# Patient Record
Sex: Male | Born: 1970 | Race: Black or African American | Hispanic: No | Smoking: Current every day smoker
Health system: Southern US, Community
[De-identification: ages and names within clinical notes are randomized; demographics above are authoritative.]

## PROBLEM LIST (undated history)

## (undated) DIAGNOSIS — I10 Essential (primary) hypertension: Secondary | ICD-10-CM

## (undated) DIAGNOSIS — I639 Cerebral infarction, unspecified: Secondary | ICD-10-CM

## (undated) HISTORY — PX: OTHER SURGICAL HISTORY: SHX169

---

## 2002-04-14 ENCOUNTER — Encounter: Payer: Self-pay | Admitting: Emergency Medicine

## 2002-04-14 ENCOUNTER — Emergency Department (HOSPITAL_COMMUNITY): Admission: EM | Admit: 2002-04-14 | Discharge: 2002-04-14 | Payer: Self-pay | Admitting: Emergency Medicine

## 2002-06-05 ENCOUNTER — Emergency Department (HOSPITAL_COMMUNITY): Admission: EM | Admit: 2002-06-05 | Discharge: 2002-06-05 | Payer: Self-pay | Admitting: *Deleted

## 2005-06-20 ENCOUNTER — Emergency Department (HOSPITAL_COMMUNITY): Admission: EM | Admit: 2005-06-20 | Discharge: 2005-06-20 | Payer: Self-pay | Admitting: Emergency Medicine

## 2006-08-13 ENCOUNTER — Encounter (INDEPENDENT_AMBULATORY_CARE_PROVIDER_SITE_OTHER): Payer: Self-pay | Admitting: Internal Medicine

## 2006-08-13 ENCOUNTER — Ambulatory Visit: Payer: Self-pay | Admitting: Internal Medicine

## 2006-08-13 LAB — CONVERTED CEMR LAB
AST: 22 units/L (ref 0–37)
Alkaline Phosphatase: 63 units/L (ref 39–117)
Bilirubin Urine: NEGATIVE
Calcium: 9.1 mg/dL (ref 8.4–10.5)
Eosinophils Absolute: 0.1 cells/mcL (ref 0.0–0.7)
Eosinophils Relative: 2 % (ref 0–5)
Glucose, Bld: 102 mg/dL — ABNORMAL HIGH (ref 70–99)
HCT: 46.1 % (ref 39.0–52.0)
HCV Ab: NEGATIVE
Hep B S Ab: NEGATIVE
Ketones, ur: NEGATIVE mg/dL
MCV: 84.3 fL (ref 78.0–100.0)
Monocytes Relative: 10 % (ref 3–11)
Neutro Abs: 2.8 10*3/uL (ref 1.7–7.7)
Neutrophils Relative %: 51 % (ref 43–77)
Nitrite: NEGATIVE
RBC: 5.47 M/uL (ref 4.22–5.81)
RDW: 13.1 % (ref 11.5–14.0)
Sodium: 143 meq/L (ref 135–145)
Specific Gravity, Urine: 1.02 (ref 1.005–1.03)
Total Bilirubin: 0.4 mg/dL (ref 0.3–1.2)
Total Protein: 7.1 g/dL (ref 6.0–8.3)
Urobilinogen, UA: 0.2 (ref 0.0–1.0)
pH: 6 (ref 5.0–8.0)

## 2009-06-03 ENCOUNTER — Emergency Department (HOSPITAL_COMMUNITY): Admission: EM | Admit: 2009-06-03 | Discharge: 2009-06-03 | Payer: Self-pay | Admitting: Family Medicine

## 2010-02-28 ENCOUNTER — Inpatient Hospital Stay (HOSPITAL_COMMUNITY): Admission: EM | Admit: 2010-02-28 | Discharge: 2010-03-02 | Payer: Self-pay | Admitting: Emergency Medicine

## 2010-02-28 ENCOUNTER — Ambulatory Visit: Payer: Self-pay | Admitting: Internal Medicine

## 2010-03-01 ENCOUNTER — Encounter (INDEPENDENT_AMBULATORY_CARE_PROVIDER_SITE_OTHER): Payer: Self-pay | Admitting: Neurology

## 2010-03-02 ENCOUNTER — Encounter: Payer: Self-pay | Admitting: Internal Medicine

## 2010-03-02 ENCOUNTER — Ambulatory Visit: Payer: Self-pay | Admitting: Surgery

## 2010-11-02 ENCOUNTER — Emergency Department (HOSPITAL_COMMUNITY)
Admission: EM | Admit: 2010-11-02 | Discharge: 2010-11-02 | Payer: Self-pay | Source: Home / Self Care | Admitting: Emergency Medicine

## 2010-11-06 LAB — DIFFERENTIAL
Eosinophils Absolute: 0.1 10*3/uL (ref 0.0–0.7)
Eosinophils Relative: 2 % (ref 0–5)
Lymphocytes Relative: 36 % (ref 12–46)
Monocytes Absolute: 1 10*3/uL (ref 0.1–1.0)
Neutrophils Relative %: 49 % (ref 43–77)

## 2010-11-06 LAB — POCT I-STAT, CHEM 8
Calcium, Ion: 1.14 mmol/L (ref 1.12–1.32)
Chloride: 104 mEq/L (ref 96–112)
Creatinine, Ser: 1.1 mg/dL (ref 0.4–1.5)
Glucose, Bld: 72 mg/dL (ref 70–99)
Hemoglobin: 17 g/dL (ref 13.0–17.0)
Sodium: 141 mEq/L (ref 135–145)

## 2010-11-06 LAB — CK: Total CK: 183 U/L (ref 7–232)

## 2010-11-06 LAB — TROPONIN I: Troponin I: 0.02 ng/mL (ref 0.00–0.06)

## 2010-11-06 LAB — CBC
HCT: 46.7 % (ref 39.0–52.0)
MCV: 86.5 fL (ref 78.0–100.0)
Platelets: 220 10*3/uL (ref 150–400)

## 2011-01-01 LAB — CARDIAC PANEL(CRET KIN+CKTOT+MB+TROPI)
CK, MB: 1.2 ng/mL (ref 0.3–4.0)
Relative Index: 0.9 (ref 0.0–2.5)
Total CK: 128 U/L (ref 7–232)
Troponin I: <0.01 ng/mL (ref 0.00–0.06)

## 2011-01-01 LAB — BASIC METABOLIC PANEL
CO2: 27 mEq/L (ref 19–32)
Calcium: 8.7 mg/dL (ref 8.4–10.5)
GFR calc Af Amer: >60 mL/min (ref >60)

## 2011-01-01 LAB — COMPREHENSIVE METABOLIC PANEL
Albumin: 3.7 g/dL (ref 3.5–5.2)
BUN: 6 mg/dL (ref 6–23)
GFR calc Af Amer: >60 mL/min (ref >60)
Glucose, Bld: 85 mg/dL (ref 70–99)
Sodium: 142 mEq/L (ref 135–145)
Total Bilirubin: 0.8 mg/dL (ref 0.3–1.2)
Total Protein: 6.8 g/dL (ref 6.0–8.3)

## 2011-01-01 LAB — DIFFERENTIAL
Basophils Relative: 0 % (ref 0–1)
Eosinophils Absolute: 0.1 10*3/uL (ref 0.0–0.7)
Eosinophils Absolute: 0.2 10*3/uL (ref 0.0–0.7)
Eosinophils Relative: 1 % (ref 0–5)
Eosinophils Relative: 2 % (ref 0–5)
Lymphs Abs: 2.4 10*3/uL (ref 0.7–4.0)
Lymphs Abs: 2.5 10*3/uL (ref 0.7–4.0)
Neutro Abs: 3.4 10*3/uL (ref 1.7–7.7)
Neutrophils Relative %: 60 % (ref 43–77)

## 2011-01-01 LAB — URINALYSIS, ROUTINE W REFLEX MICROSCOPIC
Bilirubin Urine: NEGATIVE
Glucose, UA: NEGATIVE mg/dL
Nitrite: NEGATIVE
Protein, ur: NEGATIVE mg/dL
pH: 5 (ref 5.0–8.0)

## 2011-01-01 LAB — GLUCOSE, CAPILLARY
Glucose-Capillary: 103 mg/dL — ABNORMAL HIGH (ref 70–99)
Glucose-Capillary: 117 mg/dL — ABNORMAL HIGH (ref 70–99)
Glucose-Capillary: 73 mg/dL (ref 70–99)

## 2011-01-01 LAB — CK TOTAL AND CKMB (NOT AT ARMC)
Relative Index: 1 (ref 0.0–2.5)
Total CK: 128 U/L (ref 7–232)

## 2011-01-01 LAB — PROTIME-INR
INR: 1.04 (ref 0.00–1.49)
Prothrombin Time: 12.8 seconds (ref 11.6–15.2)
Prothrombin Time: 13.5 seconds (ref 11.6–15.2)

## 2011-01-01 LAB — LUPUS ANTICOAGULANT PANEL: Lupus Anticoagulant: NOT DETECTED

## 2011-01-01 LAB — ETHANOL: Alcohol, Ethyl (B): <5 mg/dL (ref 0–10)

## 2011-01-01 LAB — CBC
HCT: 47.5 % (ref 39.0–52.0)
Hemoglobin: 15.8 g/dL (ref 13.0–17.0)
MCHC: 34.5 g/dL (ref 30.0–36.0)
MCHC: 34.5 g/dL (ref 30.0–36.0)
Platelets: 202 10*3/uL (ref 150–400)
Platelets: 210 10*3/uL (ref 150–400)
RDW: 12.6 % (ref 11.5–15.5)
RDW: 12.8 % (ref 11.5–15.5)
WBC: 6.7 10*3/uL (ref 4.0–10.5)

## 2011-01-01 LAB — CARDIOLIPIN ANTIBODIES, IGG, IGM, IGA: Anticardiolipin IgM: 3 MPL U/mL — ABNORMAL LOW (ref <11)

## 2011-01-01 LAB — BETA-2-GLYCOPROTEIN I ABS, IGG/M/A
Beta-2 Glyco I IgG: 1 G Units (ref <20)
Beta-2-Glycoprotein I IgA: 4 A Units (ref <20)

## 2011-01-01 LAB — TROPONIN I: Troponin I: <0.01 ng/mL (ref 0.00–0.06)

## 2011-01-01 LAB — LIPID PANEL
Cholesterol: 173 mg/dL (ref 0–200)
HDL: 46 mg/dL (ref >39)
Total CHOL/HDL Ratio: 3.8 RATIO
VLDL: 15 mg/dL (ref 0–40)

## 2011-01-01 LAB — ANTITHROMBIN III: AntiThromb III Func: 99 % (ref 76–126)

## 2011-01-01 LAB — RAPID URINE DRUG SCREEN, HOSP PERFORMED: Tetrahydrocannabinol: POSITIVE — AB

## 2011-01-01 LAB — URINE MICROSCOPIC-ADD ON

## 2011-01-01 LAB — FACTOR 5 LEIDEN

## 2011-01-01 LAB — PROTEIN S, TOTAL: Protein S Ag, Total: 82 % (ref 70–140)

## 2011-01-01 LAB — MRSA PCR SCREENING: MRSA by PCR: NEGATIVE

## 2011-01-01 LAB — APTT: aPTT: 30 seconds (ref 24–37)

## 2015-09-16 ENCOUNTER — Emergency Department (HOSPITAL_COMMUNITY)
Admission: EM | Admit: 2015-09-16 | Discharge: 2015-09-16 | Disposition: A | Discharge status: Home / Self Care | Payer: Self-pay | Attending: Emergency Medicine | Admitting: Emergency Medicine

## 2015-09-16 ENCOUNTER — Encounter (HOSPITAL_COMMUNITY): Payer: Self-pay | Admitting: Emergency Medicine

## 2015-09-16 DIAGNOSIS — I10 Essential (primary) hypertension: Secondary | ICD-10-CM | POA: Insufficient documentation

## 2015-09-16 DIAGNOSIS — Z8673 Personal history of transient ischemic attack (TIA), and cerebral infarction without residual deficits: Secondary | ICD-10-CM | POA: Insufficient documentation

## 2015-09-16 DIAGNOSIS — L03211 Cellulitis of face: Secondary | ICD-10-CM | POA: Insufficient documentation

## 2015-09-16 DIAGNOSIS — F17200 Nicotine dependence, unspecified, uncomplicated: Secondary | ICD-10-CM | POA: Insufficient documentation

## 2015-09-16 HISTORY — DX: Essential (primary) hypertension: I10

## 2015-09-16 HISTORY — DX: Cerebral infarction, unspecified: I63.9

## 2015-09-16 MED ORDER — SULFAMETHOXAZOLE-TRIMETHOPRIM 800-160 MG PO TABS: 1.0000 | ORAL_TABLET | Freq: Two times a day (BID) | ORAL | Status: AC

## 2015-09-16 MED ORDER — MUPIROCIN CALCIUM 2 % NA OINT: TOPICAL_OINTMENT | NASAL | Status: DC

## 2015-09-16 MED ORDER — CEPHALEXIN 500 MG PO CAPS: 500.0000 mg | ORAL_CAPSULE | Freq: Two times a day (BID) | ORAL | Status: DC

## 2015-09-16 NOTE — ED Notes (Signed)
Pt has hx of CVA 4 years ago. BP elevated. No longer has PCP. Was preciously on "blood thinner" but just takes ASA now.

## 2015-09-16 NOTE — ED Notes (Signed)
Swelling to right nare that has extended through entire nose, onset 3-4 days ago. States had a 'pimple' there for several months that was not sore until last 3-4 days.
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
## 2015-09-16 NOTE — Discharge Instructions (Signed)
Cellulitis °Cellulitis is an infection of the skin and the tissue beneath it. The infected area is usually red and tender. Cellulitis occurs most often in the arms and lower legs.  °CAUSES  °Cellulitis is caused by bacteria that enter the skin through cracks or cuts in the skin. The most common types of bacteria that cause cellulitis are staphylococci and streptococci. °SIGNS AND SYMPTOMS  °· Redness and warmth. °· Swelling. °· Tenderness or pain. °· Fever. °DIAGNOSIS  °Your health care provider can usually determine what is wrong based on a physical exam. Blood tests may also be done. °TREATMENT  °Treatment usually involves taking an antibiotic medicine. °HOME CARE INSTRUCTIONS  °· Take your antibiotic medicine as directed by your health care provider. Finish the antibiotic even if you start to feel better. °· Keep the infected arm or leg elevated to reduce swelling. °· Apply a warm cloth to the affected area up to 4 times per day to relieve pain. °· Take medicines only as directed by your health care provider. °· Keep all follow-up visits as directed by your health care provider. °SEEK MEDICAL CARE IF:  °· You notice red streaks coming from the infected area. °· Your red area gets larger or turns dark in color. °· Your bone or joint underneath the infected area becomes painful after the skin has healed. °· Your infection returns in the same area or another area. °· You notice a swollen bump in the infected area. °· You develop new symptoms. °· You have a fever. °SEEK IMMEDIATE MEDICAL CARE IF:  °· You feel very sleepy. °· You develop vomiting or diarrhea. °· You have a general ill feeling (malaise) with muscle aches and pains. °  °This information is not intended to replace advice given to you by your health care provider. Make sure you discuss any questions you have with your health care provider. °  °Document Released: 07/11/2005 Document Revised: 06/22/2015 Document Reviewed: 12/17/2011 °Elsevier Interactive  Patient Education ©2016 Elsevier Inc. ° °Hypertension °Hypertension, commonly called high blood pressure, is when the force of blood pumping through your arteries is too strong. Your arteries are the blood vessels that carry blood from your heart throughout your body. A blood pressure reading consists of a higher number over a lower number, such as 110/72. The higher number (systolic) is the pressure inside your arteries when your heart pumps. The lower number (diastolic) is the pressure inside your arteries when your heart relaxes. Ideally you want your blood pressure below 120/80. °Hypertension forces your heart to work harder to pump blood. Your arteries may become narrow or stiff. Having untreated or uncontrolled hypertension can cause heart attack, stroke, kidney disease, and other problems. °RISK FACTORS °Some risk factors for high blood pressure are controllable. Others are not.  °Risk factors you cannot control include:  °· Race. You may be at higher risk if you are African American. °· Age. Risk increases with age. °· Gender. Men are at higher risk than women before age 45 years. After age 65, women are at higher risk than men. °Risk factors you can control include: °· Not getting enough exercise or physical activity. °· Being overweight. °· Getting too much fat, sugar, calories, or salt in your diet. °· Drinking too much alcohol. °SIGNS AND SYMPTOMS °Hypertension does not usually cause signs or symptoms. Extremely high blood pressure (hypertensive crisis) may cause headache, anxiety, shortness of breath, and nosebleed. °DIAGNOSIS °To check if you have hypertension, your health care provider will measure your   blood pressure while you are seated, with your arm held at the level of your heart. It should be measured at least twice using the same arm. Certain conditions can cause a difference in blood pressure between your right and left arms. A blood pressure reading that is higher than normal on one occasion  does not mean that you need treatment. If it is not clear whether you have high blood pressure, you may be asked to return on a different day to have your blood pressure checked again. Or, you may be asked to monitor your blood pressure at home for 1 or more weeks. TREATMENT Treating high blood pressure includes making lifestyle changes and possibly taking medicine. Living a healthy lifestyle can help lower high blood pressure. You may need to change some of your habits. Lifestyle changes may include:  Following the DASH diet. This diet is high in fruits, vegetables, and whole grains. It is low in salt, red meat, and added sugars.  Keep your sodium intake below 2,300 mg per day.  Getting at least 30-45 minutes of aerobic exercise at least 4 times per week.  Losing weight if necessary.  Not smoking.  Limiting alcoholic beverages.  Learning ways to reduce stress. Your health care provider may prescribe medicine if lifestyle changes are not enough to get your blood pressure under control, and if one of the following is true:  You are 42-62 years of age and your systolic blood pressure is above 140.  You are 86 years of age or older, and your systolic blood pressure is above 150.  Your diastolic blood pressure is above 90.  You have diabetes, and your systolic blood pressure is over 140 or your diastolic blood pressure is over 90.  You have kidney disease and your blood pressure is above 140/90.  You have heart disease and your blood pressure is above 140/90. Your personal target blood pressure may vary depending on your medical conditions, your age, and other factors. HOME CARE INSTRUCTIONS  Have your blood pressure rechecked as directed by your health care provider.   Take medicines only as directed by your health care provider. Follow the directions carefully. Blood pressure medicines must be taken as prescribed. The medicine does not work as well when you skip doses. Skipping  doses also puts you at risk for problems.  Do not smoke.   Monitor your blood pressure at home as directed by your health care provider. SEEK MEDICAL CARE IF:   You think you are having a reaction to medicines taken.  You have recurrent headaches or feel dizzy.  You have swelling in your ankles.  You have trouble with your vision. SEEK IMMEDIATE MEDICAL CARE IF:  You develop a severe headache or confusion.  You have unusual weakness, numbness, or feel faint.  You have severe chest or abdominal pain.  You vomit repeatedly.  You have trouble breathing. MAKE SURE YOU:   Understand these instructions.  Will watch your condition.  Will get help right away if you are not doing well or get worse.   This information is not intended to replace advice given to you by your health care provider. Make sure you discuss any questions you have with your health care provider.   Document Released: 10/01/2005 Document Revised: 02/15/2015 Document Reviewed: 07/24/2013 Elsevier Interactive Patient Education 2016 Elsevier Inc.  DASH Eating Plan DASH stands for "Dietary Approaches to Stop Hypertension." The DASH eating plan is a healthy eating plan that has been shown to  reduce high blood pressure (hypertension). Additional health benefits may include reducing the risk of type 2 diabetes mellitus, heart disease, and stroke. The DASH eating plan may also help with weight loss. WHAT DO I NEED TO KNOW ABOUT THE DASH EATING PLAN? For the DASH eating plan, you will follow these general guidelines:  Choose foods with a percent daily value for sodium of less than 5% (as listed on the food label).  Use salt-free seasonings or herbs instead of table salt or sea salt.  Check with your health care provider or pharmacist before using salt substitutes.  Eat lower-sodium products, often labeled as "lower sodium" or "no salt added."  Eat fresh foods.  Eat more vegetables, fruits, and low-fat dairy  products.  Choose whole grains. Look for the word "whole" as the first word in the ingredient list.  Choose fish and skinless chicken or [place] more often than red meat. Limit fish, poultry, and meat to 6 oz (170 g) each day.  Limit sweets, desserts, sugars, and sugary drinks.  Choose heart-healthy fats.  Limit cheese to 1 oz (28 g) per day.  Eat more home-cooked food and less restaurant, buffet, and fast food.  Limit fried foods.  Cook foods using methods other than frying.  Limit canned vegetables. If you do use them, rinse them well to decrease the sodium.  When eating at a restaurant, ask that your food be prepared with less salt, or no salt if possible. WHAT FOODS CAN I EAT? Seek help from a dietitian for individual calorie needs. Grains Whole grain or whole wheat bread. Brown rice. Whole grain or whole wheat pasta. Quinoa, bulgur, and whole grain cereals. Low-sodium cereals. Corn or whole wheat flour tortillas. Whole grain cornbread. Whole grain crackers. Low-sodium crackers. Vegetables Fresh or frozen vegetables (raw, steamed, roasted, or grilled). Low-sodium or reduced-sodium tomato and vegetable juices. Low-sodium or reduced-sodium tomato sauce and paste. Low-sodium or reduced-sodium canned vegetables.  Fruits All fresh, canned (in natural juice), or frozen fruits. Meat and Other Protein Products Ground beef (85% or leaner), grass-fed beef, or beef trimmed of fat. Skinless chicken or [place]. Ground chicken or [place]. Pork trimmed of fat. All fish and seafood. Eggs. Dried beans, peas, or lentils. Unsalted nuts and seeds. Unsalted canned beans. Dairy Low-fat dairy products, such as skim or 1% milk, 2% or reduced-fat cheeses, low-fat ricotta or cottage cheese, or plain low-fat yogurt. Low-sodium or reduced-sodium cheeses. Fats and Oils Tub margarines without trans fats. Light or reduced-fat mayonnaise and salad dressings (reduced sodium). Avocado. Safflower, olive, or canola  oils. Natural peanut or almond butter. Other Unsalted popcorn and pretzels. The items listed above may not be a complete list of recommended foods or beverages. Contact your dietitian for more options. WHAT FOODS ARE NOT RECOMMENDED? Grains White bread. White pasta. White rice. Refined cornbread. Bagels and croissants. Crackers that contain trans fat. Vegetables Creamed or fried vegetables. Vegetables in a cheese sauce. Regular canned vegetables. Regular canned tomato sauce and paste. Regular tomato and vegetable juices. Fruits Dried fruits. Canned fruit in light or heavy syrup. Fruit juice. Meat and Other Protein Products Fatty cuts of meat. Ribs, chicken wings, bacon, sausage, bologna, salami, chitterlings, fatback, hot dogs, bratwurst, and packaged luncheon meats. Salted nuts and seeds. Canned beans with salt. Dairy Whole or 2% milk, cream, half-and-half, and cream cheese. Whole-fat or sweetened yogurt. Full-fat cheeses or blue cheese. Nondairy creamers and whipped toppings. Processed cheese, cheese spreads, or cheese curds. Condiments Onion and garlic salt, seasoned salt, table  salt, and sea salt. Canned and packaged gravies. Worcestershire sauce. Tartar sauce. Barbecue sauce. Teriyaki sauce. Soy sauce, including reduced sodium. Steak sauce. Fish sauce. Oyster sauce. Cocktail sauce. Horseradish. Ketchup and mustard. Meat flavorings and tenderizers. Bouillon cubes. Hot sauce. Tabasco sauce. Marinades. Taco seasonings. Relishes. Fats and Oils Butter, stick margarine, lard, shortening, ghee, and bacon fat. Coconut, palm kernel, or palm oils. Regular salad dressings. Other Pickles and olives. Salted popcorn and pretzels. The items listed above may not be a complete list of foods and beverages to avoid. Contact your dietitian for more information. WHERE CAN I FIND MORE INFORMATION? National Heart, Lung, and Blood Institute: CableProm[employer].itwww.nhlbi.nih.gov/health/health-topics/topics/dash/   This  information is not intended to replace advice given to you by your health care provider. Make sure you discuss any questions you have with your health care provider.   Document Released: 09/20/2011 Document Revised: 10/22/2014 Document Reviewed: 08/05/2013 Elsevier Interactive Patient Education [employer]2016 Elsevier Inc.

## 2016-01-14 ENCOUNTER — Emergency Department (HOSPITAL_COMMUNITY)
Admission: EM | Admit: 2016-01-14 | Discharge: 2016-01-14 | Disposition: A | Discharge status: Home / Self Care | Payer: MEDICAID | Attending: Emergency Medicine | Admitting: Emergency Medicine

## 2016-01-14 ENCOUNTER — Encounter (HOSPITAL_COMMUNITY): Payer: Self-pay | Admitting: Physical Medicine and Rehabilitation

## 2016-01-14 DIAGNOSIS — Z8673 Personal history of transient ischemic attack (TIA), and cerebral infarction without residual deficits: Secondary | ICD-10-CM | POA: Insufficient documentation

## 2016-01-14 DIAGNOSIS — I10 Essential (primary) hypertension: Secondary | ICD-10-CM | POA: Insufficient documentation

## 2016-01-14 DIAGNOSIS — Z79899 Other long term (current) drug therapy: Secondary | ICD-10-CM | POA: Insufficient documentation

## 2016-01-14 DIAGNOSIS — F17200 Nicotine dependence, unspecified, uncomplicated: Secondary | ICD-10-CM | POA: Insufficient documentation

## 2016-01-14 DIAGNOSIS — L03211 Cellulitis of face: Secondary | ICD-10-CM

## 2016-01-14 MED ORDER — SULFAMETHOXAZOLE-TRIMETHOPRIM 800-160 MG PO TABS: 1.0000 | ORAL_TABLET | Freq: Two times a day (BID) | ORAL | Status: AC

## 2016-01-14 MED ORDER — CEPHALEXIN 500 MG PO CAPS: 500.0000 mg | ORAL_CAPSULE | Freq: Two times a day (BID) | ORAL | Status: DC

## 2016-01-14 MED ORDER — MUPIROCIN CALCIUM 2 % NA OINT: TOPICAL_OINTMENT | NASAL | Status: DC

## 2016-01-14 NOTE — ED Notes (Signed)
Pt reports he noticed upper lip swelling several days ago. History of angioedema. Now reports upper lip tingling and swelling. Denies respiratory issues.

## 2016-01-14 NOTE — ED Notes (Signed)
Declined W/C at D/C and was escorted to lobby by RN. 

## 2016-01-14 NOTE — Discharge Instructions (Signed)
Cellulitis Cellulitis is an infection of the skin and the tissue beneath it. The infected area is usually red and tender. Cellulitis occurs most often in the arms and lower legs.  CAUSES  Cellulitis is caused by bacteria that enter the skin through cracks or cuts in the skin. The most common types of bacteria that cause cellulitis are staphylococci and streptococci. SIGNS AND SYMPTOMS   Redness and warmth.  Swelling.  Tenderness or pain.  Fever. DIAGNOSIS  Your health care provider can usually determine what is wrong based on a physical exam. Blood tests may also be done. TREATMENT  Treatment usually involves taking an antibiotic medicine. HOME CARE INSTRUCTIONS   Take your antibiotic medicine as directed by your health care provider. Finish the antibiotic even if you start to feel better.  Keep the infected arm or leg elevated to reduce swelling.  Apply a warm cloth to the affected area up to 4 times per day to relieve pain.  Take medicines only as directed by your health care provider.  Keep all follow-up visits as directed by your health care provider. SEEK MEDICAL CARE IF:   You notice red streaks coming from the infected area.  Your red area gets larger or turns dark in color.  Your bone or joint underneath the infected area becomes painful after the skin has healed.  Your infection returns in the same area or another area.  You notice a swollen bump in the infected area.  You develop new symptoms.  You have a fever. SEEK IMMEDIATE MEDICAL CARE IF:   You feel very sleepy.  You develop vomiting or diarrhea.  You have a general ill feeling (malaise) with muscle aches and pains.   This information is not intended to replace advice given to you by your health care provider. Make sure you discuss any questions you have with your health care provider.  Apply ointment to each nostril daily. Take antibiotics as prescribed. Return to the emergency department if you  experience severe worsening of your symptoms, fever, increased facial swelling.
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
## 2016-01-14 NOTE — ED Notes (Signed)
PT was treated a few week ago for infection to upper lip. Pt thinks the meds he was given has not helped his infection to upper lip.Pt's upper lip is swollen no RESp distress noted.

## 2016-03-12 ENCOUNTER — Encounter (HOSPITAL_COMMUNITY): Payer: Self-pay | Admitting: Nurse Practitioner

## 2016-03-12 ENCOUNTER — Emergency Department (HOSPITAL_COMMUNITY)
Admission: EM | Admit: 2016-03-12 | Discharge: 2016-03-12 | Disposition: A | Discharge status: Home / Self Care | Payer: MEDICAID | Attending: Emergency Medicine | Admitting: Emergency Medicine

## 2016-03-12 DIAGNOSIS — Z792 Long term (current) use of antibiotics: Secondary | ICD-10-CM | POA: Insufficient documentation

## 2016-03-12 DIAGNOSIS — Z8639 Personal history of other endocrine, nutritional and metabolic disease: Secondary | ICD-10-CM | POA: Insufficient documentation

## 2016-03-12 DIAGNOSIS — F17200 Nicotine dependence, unspecified, uncomplicated: Secondary | ICD-10-CM | POA: Insufficient documentation

## 2016-03-12 DIAGNOSIS — Z79899 Other long term (current) drug therapy: Secondary | ICD-10-CM | POA: Insufficient documentation

## 2016-03-12 DIAGNOSIS — M542 Cervicalgia: Secondary | ICD-10-CM | POA: Insufficient documentation

## 2016-03-12 DIAGNOSIS — Z8673 Personal history of transient ischemic attack (TIA), and cerebral infarction without residual deficits: Secondary | ICD-10-CM | POA: Insufficient documentation

## 2016-03-12 MED ORDER — CYCLOBENZAPRINE HCL 10 MG PO TABS: 10.0000 mg | ORAL_TABLET | Freq: Two times a day (BID) | ORAL | Status: DC | PRN

## 2016-03-12 MED ORDER — DIAZEPAM 5 MG PO TABS: 5.0000 mg | ORAL_TABLET | Freq: Two times a day (BID) | ORAL | Status: DC
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
## 2016-03-12 NOTE — ED Notes (Signed)
He c/o 3 day history of neck pain. Describes as a "crick" in neck. He tried heat, ice, aleve with no relief. He is alert and breathing easily

## 2016-12-12 ENCOUNTER — Emergency Department (HOSPITAL_COMMUNITY)
Admission: EM | Admit: 2016-12-12 | Discharge: 2016-12-12 | Disposition: A | Discharge status: Home / Self Care | Payer: Self-pay | Attending: Emergency Medicine | Admitting: Emergency Medicine

## 2016-12-12 DIAGNOSIS — Z8673 Personal history of transient ischemic attack (TIA), and cerebral infarction without residual deficits: Secondary | ICD-10-CM | POA: Insufficient documentation

## 2016-12-12 DIAGNOSIS — Z79899 Other long term (current) drug therapy: Secondary | ICD-10-CM | POA: Insufficient documentation

## 2016-12-12 DIAGNOSIS — L03211 Cellulitis of face: Secondary | ICD-10-CM | POA: Insufficient documentation

## 2016-12-12 DIAGNOSIS — I10 Essential (primary) hypertension: Secondary | ICD-10-CM | POA: Insufficient documentation

## 2016-12-12 DIAGNOSIS — F17200 Nicotine dependence, unspecified, uncomplicated: Secondary | ICD-10-CM | POA: Insufficient documentation

## 2016-12-12 MED ORDER — SULFAMETHOXAZOLE-TRIMETHOPRIM 800-160 MG PO TABS: 1.0000 | ORAL_TABLET | Freq: Two times a day (BID) | ORAL | 0 refills | Status: AC

## 2016-12-12 MED ORDER — SULFAMETHOXAZOLE-TRIMETHOPRIM 800-160 MG PO TABS
1.0000 | ORAL_TABLET | Freq: Two times a day (BID) | ORAL | 0 refills | Status: DC
Start: 2016-12-12 — End: 2016-12-12

## 2016-12-12 MED ORDER — MUPIROCIN 2 % EX OINT: 1.0000 "application " | TOPICAL_OINTMENT | Freq: Three times a day (TID) | CUTANEOUS | 0 refills | Status: DC

## 2016-12-12 MED ORDER — CEPHALEXIN 500 MG PO CAPS: 1000.0000 mg | ORAL_CAPSULE | Freq: Two times a day (BID) | ORAL | 0 refills | Status: DC

## 2016-12-12 NOTE — ED Triage Notes (Signed)
Pt from home w/ c/o swelling to face. Pt sts he has hx of same w/ cellulitis to back and face. Sts he was prescribed penicillin last time and he sts he believes it came back. Pt RR even and unlabored. Denies pain @ this time.
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
## 2017-02-15 ENCOUNTER — Emergency Department (HOSPITAL_COMMUNITY)
Admission: EM | Admit: 2017-02-15 | Discharge: 2017-02-15 | Disposition: A | Discharge status: Home / Self Care | Payer: Self-pay | Attending: Emergency Medicine | Admitting: Emergency Medicine

## 2017-02-15 ENCOUNTER — Encounter (HOSPITAL_COMMUNITY): Payer: Self-pay | Admitting: Emergency Medicine

## 2017-02-15 ENCOUNTER — Emergency Department (HOSPITAL_COMMUNITY): Payer: Self-pay

## 2017-02-15 DIAGNOSIS — I10 Essential (primary) hypertension: Secondary | ICD-10-CM | POA: Insufficient documentation

## 2017-02-15 DIAGNOSIS — Z79899 Other long term (current) drug therapy: Secondary | ICD-10-CM | POA: Insufficient documentation

## 2017-02-15 DIAGNOSIS — R935 Abnormal findings on diagnostic imaging of other abdominal regions, including retroperitoneum: Secondary | ICD-10-CM | POA: Insufficient documentation

## 2017-02-15 DIAGNOSIS — L03317 Cellulitis of buttock: Secondary | ICD-10-CM | POA: Insufficient documentation

## 2017-02-15 DIAGNOSIS — F17200 Nicotine dependence, unspecified, uncomplicated: Secondary | ICD-10-CM | POA: Insufficient documentation

## 2017-02-15 DIAGNOSIS — Z8673 Personal history of transient ischemic attack (TIA), and cerebral infarction without residual deficits: Secondary | ICD-10-CM | POA: Insufficient documentation

## 2017-02-15 LAB — CBC WITH DIFFERENTIAL/PLATELET
BASOS PCT: 0 %
Basophils Absolute: 0 10*3/uL (ref 0.0–0.1)
EOS PCT: 1 %
Eosinophils Absolute: 0.1 10*3/uL (ref 0.0–0.7)
HCT: 46.8 % (ref 39.0–52.0)
Hemoglobin: 16.4 g/dL (ref 13.0–17.0)
Lymphocytes Relative: 29 %
Lymphs Abs: 2.8 10*3/uL (ref 0.7–4.0)
MCH: 30.5 pg (ref 26.0–34.0)
MCHC: 35 g/dL (ref 30.0–36.0)
MCV: 87 fL (ref 78.0–100.0)
Monocytes Absolute: 1.1 10*3/uL — ABNORMAL HIGH (ref 0.1–1.0)
Monocytes Relative: 11 %
NEUTROS PCT: 59 %
Neutro Abs: 5.8 10*3/uL (ref 1.7–7.7)
PLATELETS: 194 10*3/uL (ref 150–400)
RBC: 5.38 MIL/uL (ref 4.22–5.81)
RDW: 12.6 % (ref 11.5–15.5)
WBC: 9.8 10*3/uL (ref 4.0–10.5)

## 2017-02-15 LAB — BASIC METABOLIC PANEL
Anion gap: 6 (ref 5–15)
BUN: 8 mg/dL (ref 6–20)
CHLORIDE: 104 mmol/L (ref 101–111)
CO2: 29 mmol/L (ref 22–32)
Calcium: 8.9 mg/dL (ref 8.9–10.3)
Creatinine, Ser: 0.89 mg/dL (ref 0.61–1.24)
GFR calc Af Amer: >60 mL/min (ref >60)
GLUCOSE: 85 mg/dL (ref 65–99)
POTASSIUM: 4.4 mmol/L (ref 3.5–5.1)
Sodium: 139 mmol/L (ref 135–145)

## 2017-02-15 MED ORDER — KETOROLAC TROMETHAMINE 30 MG/ML IJ SOLN
30.0000 mg | Freq: Once | INTRAMUSCULAR | Status: AC
  Administered 2017-02-15: 30 mg via INTRAVENOUS
  Filled 2017-02-15: qty 1

## 2017-02-15 MED ORDER — SULFAMETHOXAZOLE-TRIMETHOPRIM 800-160 MG PO TABS: 1.0000 | ORAL_TABLET | Freq: Two times a day (BID) | ORAL | 0 refills | Status: AC

## 2017-02-15 MED ORDER — IOPAMIDOL (ISOVUE-300) INJECTION 61%
INTRAVENOUS | Status: AC
  Administered 2017-02-15: 100 mL
  Filled 2017-02-15: qty 100

## 2017-02-15 MED ORDER — HYDROCODONE-ACETAMINOPHEN 5-325 MG PO TABS: 2.0000 | ORAL_TABLET | ORAL | 0 refills | Status: DC | PRN

## 2017-02-15 NOTE — ED Notes (Signed)
Patient transported to CT 

## 2017-02-15 NOTE — ED Triage Notes (Signed)
Onset one day ago developed abscess buttocks increase in size and pain today.

## 2017-02-15 NOTE — ED Notes (Signed)
Pt is in stable condition upon d/c and ambulates from ED. 

## 2017-02-15 NOTE — Discharge Instructions (Signed)
Take antibiotics as directed. Please take all of your antibiotics until finished.  Intake pain medication as prescribed for treatment of pain.  Follow up with the clinics listed below for outpatient follow-up. Call them and arrange for follow-up appointment in the next 24-48 hours.  Return the emergency Department for any fever, worsening pain, worsening swelling, worsening redness or any other worsening or concerning symptoms.  If you do not have a primary care doctor you see regularly, please you the list below. Please call them to arrange for follow-up.    No Primary Care Doctor Call [facility]  [phone] Other agencies that provide inexpensive medical care    [facility]  [phone]    [facility]  [phone]    [facility]  [phone]    [facility]  [phone]    [facility]  [phone]    [facility]  [phone]
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
## 2017-12-07 ENCOUNTER — Emergency Department (HOSPITAL_COMMUNITY)
Admission: EM | Admit: 2017-12-07 | Discharge: 2017-12-07 | Disposition: A | Discharge status: Home / Self Care | Payer: BLUE CROSS/BLUE SHIELD | Attending: Emergency Medicine | Admitting: Emergency Medicine

## 2017-12-07 DIAGNOSIS — F17200 Nicotine dependence, unspecified, uncomplicated: Secondary | ICD-10-CM | POA: Diagnosis not present

## 2017-12-07 DIAGNOSIS — L03211 Cellulitis of face: Secondary | ICD-10-CM | POA: Diagnosis not present

## 2017-12-07 DIAGNOSIS — R220 Localized swelling, mass and lump, head: Secondary | ICD-10-CM

## 2017-12-07 DIAGNOSIS — Z79899 Other long term (current) drug therapy: Secondary | ICD-10-CM | POA: Insufficient documentation

## 2017-12-07 DIAGNOSIS — I10 Essential (primary) hypertension: Secondary | ICD-10-CM | POA: Insufficient documentation

## 2017-12-07 DIAGNOSIS — R600 Localized edema: Secondary | ICD-10-CM | POA: Diagnosis present

## 2017-12-07 MED ORDER — HYDROCODONE-ACETAMINOPHEN 5-325 MG PO TABS
1.0000 | ORAL_TABLET | Freq: Once | ORAL | Status: AC
  Administered 2017-12-07: 1 via ORAL
  Filled 2017-12-07: qty 1

## 2017-12-07 MED ORDER — HYDROCODONE-ACETAMINOPHEN 5-325 MG PO TABS: 2.0000 | ORAL_TABLET | Freq: Four times a day (QID) | ORAL | 0 refills | Status: DC | PRN

## 2017-12-07 MED ORDER — IBUPROFEN 400 MG PO TABS
400.0000 mg | ORAL_TABLET | Freq: Once | ORAL | Status: AC
  Administered 2017-12-07: 400 mg via ORAL
  Filled 2017-12-07: qty 1

## 2017-12-07 MED ORDER — SULFAMETHOXAZOLE-TRIMETHOPRIM 800-160 MG PO TABS: 1.0000 | ORAL_TABLET | Freq: Two times a day (BID) | ORAL | 0 refills | Status: AC

## 2017-12-07 MED ORDER — MUPIROCIN CALCIUM 2 % NA OINT: TOPICAL_OINTMENT | NASAL | 1 refills | Status: DC

## 2017-12-07 MED ORDER — SULFAMETHOXAZOLE-TRIMETHOPRIM 800-160 MG PO TABS
1.0000 | ORAL_TABLET | Freq: Once | ORAL | Status: AC
  Administered 2017-12-07: 1 via ORAL
  Filled 2017-12-07: qty 1

## 2017-12-07 NOTE — ED Notes (Signed)
Pt departed in NAD, refused use of wheelchair.  

## 2017-12-07 NOTE — ED Triage Notes (Signed)
Pt to ER for evaluation of facial pain and swelling, swelling begins at inferior right nostril and extends to lips, area is indurated and red.
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
## 2020-02-09 ENCOUNTER — Encounter (HOSPITAL_COMMUNITY): Payer: Self-pay

## 2020-02-09 ENCOUNTER — Emergency Department (HOSPITAL_COMMUNITY)
Admission: EM | Admit: 2020-02-09 | Discharge: 2020-02-09 | Disposition: A | Discharge status: Home / Self Care | Payer: BLUE CROSS/BLUE SHIELD | Attending: Emergency Medicine | Admitting: Emergency Medicine

## 2020-02-09 ENCOUNTER — Other Ambulatory Visit: Payer: Self-pay

## 2020-02-09 DIAGNOSIS — Z79899 Other long term (current) drug therapy: Secondary | ICD-10-CM | POA: Insufficient documentation

## 2020-02-09 DIAGNOSIS — L03211 Cellulitis of face: Secondary | ICD-10-CM

## 2020-02-09 DIAGNOSIS — Z8673 Personal history of transient ischemic attack (TIA), and cerebral infarction without residual deficits: Secondary | ICD-10-CM | POA: Insufficient documentation

## 2020-02-09 DIAGNOSIS — I10 Essential (primary) hypertension: Secondary | ICD-10-CM | POA: Insufficient documentation

## 2020-02-09 DIAGNOSIS — F1210 Cannabis abuse, uncomplicated: Secondary | ICD-10-CM | POA: Insufficient documentation

## 2020-02-09 DIAGNOSIS — F17210 Nicotine dependence, cigarettes, uncomplicated: Secondary | ICD-10-CM | POA: Insufficient documentation

## 2020-02-09 MED ORDER — SULFAMETHOXAZOLE-TRIMETHOPRIM 800-160 MG PO TABS: 1.0000 | ORAL_TABLET | Freq: Two times a day (BID) | ORAL | 0 refills | Status: AC

## 2020-02-09 MED ORDER — MUPIROCIN CALCIUM 2 % EX CREA: 1.0000 "application " | TOPICAL_CREAM | Freq: Two times a day (BID) | CUTANEOUS | 0 refills | Status: DC
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
## 2020-02-09 NOTE — Discharge Instructions (Addendum)
Please determine whether or not your [employer] is still active.  If not, he will need to get established with [facility]. Ultimately may require referral to dermatology given your chronic facial and back cellulitis symptoms.  Please take the Bactrim, as prescribed. You may also continue to apply topical Bactroban to your upper lip.    Please return to the ED or seek immediate medical attention should experience any new or worsening symptoms.

## 2020-02-09 NOTE — ED Triage Notes (Signed)
Patient complains of facial swelling and tingling after being seen in past for same. States that this started after he thinks dust exposure at work. Patient in no distress, wants more effective medication

## 2020-03-10 ENCOUNTER — Inpatient Hospital Stay (INDEPENDENT_AMBULATORY_CARE_PROVIDER_SITE_OTHER): Payer: Self-pay | Admitting: Primary Care

## 2020-08-12 ENCOUNTER — Ambulatory Visit (HOSPITAL_COMMUNITY): Admission: EM | Admit: 2020-08-12 | Discharge: 2020-08-12 | Discharge status: Against Medical Advice | Payer: Self-pay

## 2020-08-12 ENCOUNTER — Other Ambulatory Visit: Payer: Self-pay

## 2021-01-07 ENCOUNTER — Emergency Department (HOSPITAL_COMMUNITY): Payer: Medicaid Other

## 2021-01-07 ENCOUNTER — Emergency Department (HOSPITAL_COMMUNITY)
Admission: EM | Admit: 2021-01-07 | Discharge: 2021-01-07 | Disposition: A | Discharge status: Home / Self Care | Payer: Medicaid Other | Attending: Emergency Medicine | Admitting: Emergency Medicine

## 2021-01-07 ENCOUNTER — Encounter (HOSPITAL_COMMUNITY): Payer: Self-pay | Admitting: Emergency Medicine

## 2021-01-07 DIAGNOSIS — S4992XA Unspecified injury of left shoulder and upper arm, initial encounter: Secondary | ICD-10-CM

## 2021-01-07 DIAGNOSIS — M79662 Pain in left lower leg: Secondary | ICD-10-CM | POA: Insufficient documentation

## 2021-01-07 DIAGNOSIS — S46812A Strain of other muscles, fascia and tendons at shoulder and upper arm level, left arm, initial encounter: Secondary | ICD-10-CM

## 2021-01-07 DIAGNOSIS — I10 Essential (primary) hypertension: Secondary | ICD-10-CM | POA: Insufficient documentation

## 2021-01-07 DIAGNOSIS — F17200 Nicotine dependence, unspecified, uncomplicated: Secondary | ICD-10-CM | POA: Insufficient documentation

## 2021-01-07 DIAGNOSIS — M25512 Pain in left shoulder: Secondary | ICD-10-CM | POA: Insufficient documentation

## 2021-01-07 DIAGNOSIS — Y92481 Parking lot as the place of occurrence of the external cause: Secondary | ICD-10-CM | POA: Insufficient documentation

## 2021-01-07 DIAGNOSIS — M545 Low back pain, unspecified: Secondary | ICD-10-CM | POA: Insufficient documentation

## 2021-01-07 MED ORDER — CYCLOBENZAPRINE HCL 10 MG PO TABS: 10.0000 mg | ORAL_TABLET | Freq: Three times a day (TID) | ORAL | 0 refills | Status: DC

## 2021-01-07 MED ORDER — NAPROXEN 500 MG PO TABS: 500.0000 mg | ORAL_TABLET | Freq: Three times a day (TID) | ORAL | 0 refills | Status: AC
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
## 2021-01-07 NOTE — ED Notes (Signed)
Patient Alert and oriented to baseline. Stable and ambulatory to baseline. Patient verbalized understanding of the discharge instructions.  Patient belongings were taken by the patient.   
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
## 2021-04-23 ENCOUNTER — Other Ambulatory Visit: Payer: Self-pay

## 2021-04-23 ENCOUNTER — Emergency Department (HOSPITAL_COMMUNITY)
Admission: EM | Admit: 2021-04-23 | Discharge: 2021-04-23 | Disposition: A | Discharge status: Home / Self Care | Payer: Medicaid Other | Attending: Emergency Medicine | Admitting: Emergency Medicine

## 2021-04-23 ENCOUNTER — Emergency Department (HOSPITAL_COMMUNITY): Payer: Medicaid Other

## 2021-04-23 ENCOUNTER — Encounter (HOSPITAL_COMMUNITY): Payer: Self-pay | Admitting: Emergency Medicine

## 2021-04-23 DIAGNOSIS — F17200 Nicotine dependence, unspecified, uncomplicated: Secondary | ICD-10-CM | POA: Insufficient documentation

## 2021-04-23 DIAGNOSIS — L0291 Cutaneous abscess, unspecified: Secondary | ICD-10-CM

## 2021-04-23 DIAGNOSIS — M25561 Pain in right knee: Secondary | ICD-10-CM

## 2021-04-23 DIAGNOSIS — I10 Essential (primary) hypertension: Secondary | ICD-10-CM | POA: Insufficient documentation

## 2021-04-23 DIAGNOSIS — M533 Sacrococcygeal disorders, not elsewhere classified: Secondary | ICD-10-CM | POA: Insufficient documentation

## 2021-04-23 DIAGNOSIS — R21 Rash and other nonspecific skin eruption: Secondary | ICD-10-CM

## 2021-04-23 NOTE — ED Triage Notes (Signed)
C/o R knee pain for several months that has been worse over the past few weeks.  Denies injury. Also reports cellulitis to back that he needs medication for.  States he has been seen here for same and at dermatologist.
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
## 2021-04-23 NOTE — Discharge Instructions (Addendum)
You came to the [facility] today to be evaluated for your right knee pain, back rash, and swelling at tailbone.  Your x-ray showed no broken bones or dislocations.  I have given you information to follow-up with the orthopedist Dr. Everardo Pacific.  Please call his office to schedule an appointment.    Your back rash may be due to acne.  Because of this I have given you information to follow-up with dermatology.  Please contact one of the providers listed on your discharge paperwork.  The swelling in your tailbone is consistent with an abscess.  There was no obvious drainable fluid as the abscess has started spontaneously draining.  Please perform warm compresses 4 times daily and sitz bath's to help with your symptoms.  Please follow-up with your primary care provider as needed.  Your blood pressure was elevated in the emergency department.  You have a history of high blood pressure.  Please follow-up with primary care for further management of your blood pressure.  Get help right away if you: Have severe pain. See red streaks on your skin spreading away from the abscess. You cannot move the joint. Your fingers or toes tingle, become numb, or turn cold and blue. You have a fever along with a joint that is red, warm, and swollen.  [facility] Dermatologists:   [facility]  3.2 [phone])  Dermatologist  [address] [place] # [place]  [phone]   Dr. Mertha Finders, MD  2.6 [phone])  Dermatologist  [address] [place]  [phone]  [facility]  3.5 (3)  [facility]  [address] [place]  [phone]   [facility]  4.0 (4)  Dermatologist  [address]  [phone]  Janalyn Harder MD  3.0 (2)  Dermatologist  [address]  [phone]  Hoyle Sauer  2.7 (6)  Dermatologist  [address] [place]  [phone]  [place] Amy Y MD  2.0 (1)  Dermatologist  [address] [place]  [phone]  [facility]   5.0 (3)  Doctor  [address]  [phone]
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
## 2021-12-18 ENCOUNTER — Emergency Department (HOSPITAL_COMMUNITY)
Admission: EM | Admit: 2021-12-18 | Discharge: 2021-12-18 | Disposition: A | Discharge status: Home / Self Care | Payer: Medicaid Other | Attending: Emergency Medicine | Admitting: Emergency Medicine

## 2021-12-18 ENCOUNTER — Encounter (HOSPITAL_COMMUNITY): Payer: Self-pay | Admitting: Emergency Medicine

## 2021-12-18 ENCOUNTER — Other Ambulatory Visit: Payer: Self-pay

## 2021-12-18 DIAGNOSIS — I10 Essential (primary) hypertension: Secondary | ICD-10-CM | POA: Insufficient documentation

## 2021-12-18 DIAGNOSIS — F17200 Nicotine dependence, unspecified, uncomplicated: Secondary | ICD-10-CM | POA: Insufficient documentation

## 2021-12-18 DIAGNOSIS — K0401 Reversible pulpitis: Secondary | ICD-10-CM | POA: Insufficient documentation

## 2021-12-18 MED ORDER — AMOXICILLIN-POT CLAVULANATE 875-125 MG PO TABS: 1.0000 | ORAL_TABLET | Freq: Two times a day (BID) | ORAL | 0 refills | Status: DC

## 2021-12-18 NOTE — ED Triage Notes (Signed)
Patient here with complaint of facial swelling that started Friday. Patient states this has happened previously and he was prescribed an ointment. No dyspnea, is speaking in complete sentences, and is in no apparent distress at this time. ?

## 2021-12-18 NOTE — ED Provider Notes (Signed)
?[facility] EMERGENCY DEPARTMENT ?Provider Note ? ? ?CSN: [id number] ?Arrival date & time: 12/18/21  1224 ? ?  ? ?History ? ?Chief Complaint  ?Patient presents with  ? Facial Swelling  ? ? ?Shawn [name] is a 51 y.o. male. ? ?Shawn [name] is a 51 y.o. male with a history of hypertension, stroke, who presents to the emergency department for evaluation of upper lip swelling.  He reports symptoms started on Friday and swelling has progressively worsened.  He reports most of the swelling is just underneath his nose within his upper lip and he reports pain on the inside of the upper lip as well.  He reports yesterday he started to notice the pain radiating down into his upper teeth.  He has not noticed an abscess or any drainage but reports he has had swelling similar to this once before and they thought it was due to cellulitis.  He denies any associated fevers or chills.  He denies any recent medication changes, not on an ACE inhibitor, no new household or facial products.  He reports he does use a charcoal based toothpaste as well as a gum detoxify or in mouthwash but he has been doing this for a few months.  No difficulty swallowing.  No other aggravating or alleviating factors. ? ? ? ?  ? ?Home Medications ?Prior to Admission medications   ?Medication Sig Start Date End Date Taking? Authorizing Provider  ?amoxicillin-clavulanate (AUGMENTIN) 875-125 MG tablet Take 1 tablet by mouth 2 (two) times daily. One po bid x 7 days 12/18/21  Yes Dartha Lodge, PA-C  ?cyclobenzaprine (FLEXERIL) 10 MG tablet Take 1 tablet (10 mg total) by mouth 3 (three) times daily. 01/07/21   Liberty Handy, PA-C  ?dexamethasone (DECADRON) 4 MG tablet Take 4 mg by mouth 2 (two) times daily.  01/18/20   [provider]  ?diphenhydrAMINE (SOMINEX) 25 MG tablet Take 25 mg by mouth at bedtime as needed for sleep.    [provider]  ?HYDROcodone-acetaminophen (NORCO/VICODIN) 5-325 MG tablet Take 2 tablets  by mouth every 6 (six) hours as needed for severe pain. ?Patient not taking: Reported on 02/09/2020 12/07/17   Mesner, Barbara Cower, MD  ?multivitamin (ONE-A-DAY MEN'S) TABS tablet Take 1 tablet by mouth daily.    [provider]  ?mupirocin cream (BACTROBAN) 2 % Apply 1 application topically 2 (two) times daily. 02/09/20   Lorelee New, PA-C  ?mupirocin nasal ointment (BACTROBAN) 2 % Apply in each nostril daily ?Patient not taking: Reported on 02/09/2020 12/07/17   Mesner, Barbara Cower, MD  ?Omega-3 Fatty Acids (FISH OIL PO) Take 1 capsule by mouth daily.     [provider]  ?traMADol (ULTRAM) 50 MG tablet Take 50 mg by mouth every 6 (six) hours as needed (for pain).  01/18/20   [provider]  ?vitamin C (ASCORBIC ACID) 500 MG tablet Take 1,000 mg by mouth daily as needed (for immune system support).     [provider]  ?VITAMIN E PO Take 1 capsule by mouth daily.    [provider]  ?   ? ?Allergies    ?Patient has no known allergies.   ? ?Review of Systems   ?Review of Systems  ?Constitutional:  Negative for chills and fever.  ?HENT:  Positive for facial swelling. Negative for dental problem, sore throat and trouble swallowing.   ?Gastrointestinal:  Negative for nausea and vomiting.  ?Skin:  Negative for rash.  ? ?Physical Exam ?Updated Vital Signs ?  BP (!) 159/95   Pulse 84   Temp 99.4 ?F (37.4 ?C) (Oral)   Resp 16   Ht 6\' 1"  (1.854 m)   Wt 90.7 kg   SpO2 99%   BMI 26.39 kg/m?  ?Physical Exam ?Vitals and nursing note reviewed.  ?Constitutional:   ?   General: He is not in acute distress. ?   Appearance: Normal appearance. He is well-developed. He is not ill-appearing or diaphoretic.  ?HENT:  ?   Head: Normocephalic and atraumatic.  ?   Mouth/Throat:  ?   Comments: Swelling and induration noted over the upper lip with erythema and swelling of the inner lip and gums over the upper front teeth without drainable abscess.  Swelling does not extend into the nares.  No swelling or  induration over the roof of the mouth, posterior oropharynx clear, normal phonation, no trismus, no exterior skin changes ?Eyes:  ?   General:     ?   Right eye: No discharge.     ?   Left eye: No discharge.  ?Cardiovascular:  ?   Rate and Rhythm: Normal rate.  ?Pulmonary:  ?   Effort: Pulmonary effort is normal. No respiratory distress.  ?Musculoskeletal:  ?   Cervical back: Neck supple.  ?Neurological:  ?   Mental Status: He is alert and oriented to person, place, and time.  ?   Coordination: Coordination normal.  ?Psychiatric:     ?   Mood and Affect: Mood normal.     ?   Behavior: Behavior normal.  ? ? ?ED Results / Procedures / Treatments   ?Labs ?(all labs ordered are listed, but only abnormal results are displayed) ?Labs Reviewed - No data to display ? ?EKG ?None ? ?Radiology ?No results found. ? ?Procedures ?Procedures  ? ? ?Medications Ordered in ED ?Medications - No data to display ? ?ED Course/ Medical Decision Making/ A&P ?  ?                        ?This patient presents to the ED for concern of upper lip swelling, this involves an extensive number of treatment options, and is a complaint that carries with it a high risk of complications and morbidity.  The differential diagnosis includes dental infection, angioedema, facial cellulitis, pulpitis/gingivitis ? ? ?Co morbidities that complicate the patient evaluation ? ?Hypertension, stroke ? ? ?Additional history obtained: ? ?External records from outside source obtained and reviewed including prior ED visits ? ? ?Medicines ordered and prescription drug management: ? ?I have reviewed the patients home medicines and have made adjustments as needed ? ? ?Test Considered: ? ?Facial imaging but given the patient has no significant area of fluctuance, no evidence of airway involvement, low suspicion for Ludwig's angina, do not feel that facial imaging is indicated ? ? ?Critical Interventions: ? ?Initiation of antibiotic therapy ? ? ?Problem List / ED  Course: ? ?Upper lip swelling which appears to be due to underlying dental infection, no obvious drainable abscess, no concern for Ludewig's angina.  No overlying exterior skin changes to suggest cellulitis.  Abscess does not extend into the nose. ?Will treat with NSAIDs and antibiotics for swelling and infection of the upper lip likely of dental origin with Augmentin ? ? ? ?Social Determinants of Health: ? ?Tobacco use ? ? ?Disposition: ?Given reassuring presentation feel patient is appropriate for discharge home with continued outpatient treatment, recommend dental follow-up. ? ? ? ? ? ? ? ?Final  Clinical Impression(s) / ED Diagnoses ?Final diagnoses:  ?Pulpitis  ? ? ?Rx / DC Orders ?ED Discharge Orders   ? ?      Ordered  ?  amoxicillin-clavulanate (AUGMENTIN) 875-125 MG tablet  2 times daily       ? 12/18/21 1617  ? ?  ?  ? ?  ? ? ?  ?Dartha Lodge, [place] ?12/18/21 2232 ? ?  ?Charlynne Pander, MD ?12/18/21 2233 ? ?

## 2021-12-18 NOTE — ED Provider Triage Note (Signed)
Emergency Medicine Provider Triage Evaluation Note ? ?Shawn [name] , a 51 y.o. male  was evaluated in triage.  Patient presents with facial swelling.  He says the facial swelling started up on Friday.  Most of the swelling is underneath his nose above his upper lip.  He says it is gradually gotten worse.  He denies any recent medications.  He is not on any ACE inhibitors.  He denies any fevers or chills.  He says the last time this happened, he was seen here and was told with some sort of infection.  He was put on some medicine that he does not know what it was and it got better. ? ?On chart review, looks like he was seen here multiple times for cellulitis of his face.  Might be what he is talking about as being the similar symptoms.  Treated with Bactrim and mupirocin on ointment with improvement.  ? ?Review of Systems  ?Positive: Facial swelling ?Negative:  ? ?Physical Exam  ?There were no vitals taken for this visit. ?Gen:   Awake, no distress   ?Resp:  Normal effort  ?MSK:   Moves extremities without difficulty  ?Other:   ? ?Medical Decision Making  ?Medically screening exam initiated at 12:29 PM.  Appropriate orders placed.  [name] was informed that the remainder of the evaluation will be completed by another provider, this initial triage assessment does not replace that evaluation, and the importance of remaining in the ED until their evaluation is complete. ? ? ?  ?Adolphus Birchwood, PA-C ?12/18/21 1232 ? ?

## 2021-12-18 NOTE — Discharge Instructions (Addendum)
Lip swelling is likely due to inflammation and infection of the gums above your upper teeth.  Take Augmentin twice daily with food to treat infection.  If symptoms not improving you will need to follow-up with your dentist or with a dentist on call.  ? ?For the acne on your back you can use any over-the-counter body wash for acne, such as Neutrogena or CeraVe and follow-up with your dermatologist for further evaluation and treatment of this. ?

## 2022-02-06 ENCOUNTER — Other Ambulatory Visit: Payer: Self-pay

## 2022-02-06 ENCOUNTER — Emergency Department (HOSPITAL_COMMUNITY)
Admission: EM | Admit: 2022-02-06 | Discharge: 2022-02-06 | Disposition: A | Discharge status: Home / Self Care | Payer: Medicaid Other | Attending: Emergency Medicine | Admitting: Emergency Medicine

## 2022-02-06 ENCOUNTER — Encounter (HOSPITAL_COMMUNITY): Payer: Self-pay | Admitting: Emergency Medicine

## 2022-02-06 DIAGNOSIS — K611 Rectal abscess: Secondary | ICD-10-CM | POA: Insufficient documentation

## 2022-02-06 MED ORDER — LIDOCAINE-EPINEPHRINE (PF) 2 %-1:200000 IJ SOLN
10.0000 mL | Freq: Once | INTRAMUSCULAR | Status: DC
  Filled 2022-02-06: qty 20

## 2022-02-06 MED ORDER — AMOXICILLIN-POT CLAVULANATE 875-125 MG PO TABS: 1.0000 | ORAL_TABLET | Freq: Two times a day (BID) | ORAL | 0 refills | Status: DC

## 2022-02-06 NOTE — Discharge Instructions (Addendum)
Return for any problem.  ?

## 2022-02-06 NOTE — ED Provider Notes (Signed)
?[facility] EMERGENCY DEPARTMENT ?Provider Note ? ? ?CSN: [id number] ?Arrival date & time: 02/06/22  1627 ? ?  ? ?History ? ?Chief Complaint  ?Patient presents with  ? Abscess  ? ? ?Shawn [name] is a 51 y.o. male. ? ?51 year old male with prior medical history as detailed below presents for evaluation.  Patient complains of painful swollen spot in his perirectal region. ? ?He reports that this is developed over the last 24 hours. ? ?He denies fever.  He denies change in his bowel movement. ? ?He reports prior history of perirectal abscess.  He denies prior history of hemorrhoids. ? ?The history is provided by the patient and medical records.  ?Abscess ?Abscess location: Perirectal area. ?Size:  0.5 x 1 cm ?Abscess quality: fluctuance   ?Abscess quality: not draining   ?Red streaking: no   ?Duration:  1 day ?Progression:  Unchanged ?Chronicity:  New ?Relieved by:  Nothing ?Worsened by:  Nothing ? ?  ? ?Home Medications ?Prior to Admission medications   ?Medication Sig Start Date End Date Taking? Authorizing Provider  ?amoxicillin-clavulanate (AUGMENTIN) 875-125 MG tablet Take 1 tablet by mouth every 12 (twelve) hours. 02/06/22  Yes Wynetta Fines, MD  ?amoxicillin-clavulanate (AUGMENTIN) 875-125 MG tablet Take 1 tablet by mouth 2 (two) times daily. One po bid x 7 days 12/18/21   Dartha Lodge, PA-C  ?cyclobenzaprine (FLEXERIL) 10 MG tablet Take 1 tablet (10 mg total) by mouth 3 (three) times daily. 01/07/21   Liberty Handy, PA-C  ?dexamethasone (DECADRON) 4 MG tablet Take 4 mg by mouth 2 (two) times daily.  01/18/20   [provider]  ?diphenhydrAMINE (SOMINEX) 25 MG tablet Take 25 mg by mouth at bedtime as needed for sleep.    [provider]  ?HYDROcodone-acetaminophen (NORCO/VICODIN) 5-325 MG tablet Take 2 tablets by mouth every 6 (six) hours as needed for severe pain. ?Patient not taking: Reported on 02/09/2020 12/07/17   Mesner, Barbara Cower, MD  ?multivitamin (ONE-A-DAY MEN'S)  TABS tablet Take 1 tablet by mouth daily.    [provider]  ?mupirocin cream (BACTROBAN) 2 % Apply 1 application topically 2 (two) times daily. 02/09/20   Lorelee New, PA-C  ?mupirocin nasal ointment (BACTROBAN) 2 % Apply in each nostril daily ?Patient not taking: Reported on 02/09/2020 12/07/17   Mesner, Barbara Cower, MD  ?Omega-3 Fatty Acids (FISH OIL PO) Take 1 capsule by mouth daily.     [provider]  ?traMADol (ULTRAM) 50 MG tablet Take 50 mg by mouth every 6 (six) hours as needed (for pain).  01/18/20   [provider]  ?vitamin C (ASCORBIC ACID) 500 MG tablet Take 1,000 mg by mouth daily as needed (for immune system support).     [provider]  ?VITAMIN E PO Take 1 capsule by mouth daily.    [provider]  ?   ? ?Allergies    ?Patient has no known allergies.   ? ?Review of Systems   ?Review of Systems  ?All other systems reviewed and are negative. ? ?Physical Exam ?Updated Vital Signs ?BP (!) 141/83 (BP Location: Right Arm)   Pulse 99   Temp 99.3 ?F (37.4 ?C) (Oral)   Resp 16   Ht 6\' 1"  (1.854 m)   Wt 90.7 kg   SpO2 100%   BMI 26.39 kg/m?  ?Physical Exam ?Vitals and nursing note reviewed.  ?Constitutional:   ?   General: He is not in acute distress. ?   Appearance: Normal  appearance. He is well-developed.  ?HENT:  ?   Head: Normocephalic and atraumatic.  ?Eyes:  ?   Conjunctiva/sclera: Conjunctivae normal.  ?   Pupils: Pupils are equal, round, and reactive to light.  ?Cardiovascular:  ?   Rate and Rhythm: Normal rate and regular rhythm.  ?   Heart sounds: Normal heart sounds.  ?Pulmonary:  ?   Effort: Pulmonary effort is normal. No respiratory distress.  ?   Breath sounds: Normal breath sounds.  ?Abdominal:  ?   General: There is no distension.  ?   Palpations: Abdomen is soft.  ?   Tenderness: There is no abdominal tenderness.  ?Genitourinary: ?   Comments: Softly fluctuant area measuring approximately 0.5 x 1 cm perirectal area.  Exam is consistent with  small perirectal abscess.  Area is without erythema or signs of cellulitis. ?Musculoskeletal:     ?   General: No deformity. Normal range of motion.  ?   Cervical back: Normal range of motion and neck supple.  ?Skin: ?   General: Skin is warm and dry.  ?Neurological:  ?   General: No focal deficit present.  ?   Mental Status: He is alert and oriented to person, place, and time.  ? ? ?ED Results / Procedures / Treatments   ?Labs ?(all labs ordered are listed, but only abnormal results are displayed) ?Labs Reviewed - No data to display ? ?EKG ?None ? ?Radiology ?No results found. ? ?Procedures ?Marland Kitchen..Incision and Drainage ? ?Date/Time: 02/06/2022 6:28 PM ?Performed by: Wynetta FinesMessick, Kirsi Hugh C, MD ?Authorized by: Wynetta FinesMessick, Jerelyn Trimarco C, MD  ? ?Consent:  ?  Consent obtained:  Verbal ?  Consent given by:  Patient ?  Risks, benefits, and alternatives were discussed: yes   ?  Risks discussed:  Bleeding and damage to other organs ?  Alternatives discussed:  No treatment ?Universal protocol:  ?  Immediately prior to procedure, a time out was called: yes   ?  Patient identity confirmed:  Verbally with patient ?Location:  ?  Type:  Abscess ?  Size:  0.5 x 1 cm ?  Location:  Anogenital ?  Anogenital location:  Perirectal ?Pre-procedure details:  ?  Skin preparation:  Antiseptic wash ?Sedation:  ?  Sedation type:  None ?Anesthesia:  ?  Anesthesia method:  Local infiltration ?  Local anesthetic:  Lidocaine 2% WITH epi ?Procedure type:  ?  Complexity:  Simple ?Procedure details:  ?  Ultrasound guidance: no   ?  Needle aspiration: yes   ?  Needle size:  18 G ?  Incision types:  Stab incision ?  Incision depth:  Dermal ?  Wound management:  Irrigated with saline ?  Drainage:  Purulent ?  Drainage amount:  Scant ?  Wound treatment:  Wound left open ?  Packing materials:  None ?Post-procedure details:  ?  Procedure completion:  Tolerated well, no immediate complications  ? ? ?Medications Ordered in ED ?Medications  ?lidocaine-EPINEPHrine (XYLOCAINE  W/EPI) 2 %-1:200000 (PF) injection 10 mL (has no administration in time range)  ? ? ?ED Course/ Medical Decision Making/ A&P ?  ?                        ?Medical Decision Making ?Risk ?Prescription drug management. ? ? ? ?Medical Screen Complete ? ?This patient presented to the ED with complaint of rectal pain and swelling. ? ?This complaint involves an extensive number of treatment options. The initial differential diagnosis includes, but  is not limited to, rectal abscess versus hemorrhoid, etc ? ?This presentation is: Acute, Self-Limited, Previously Undiagnosed, and Uncertain Prognosis ? ?Patient with prior history of reported perirectal abscess. ?He now presents with complaint of 24 hours of perirectal pain consistent with recurrent abscess. ? ?He is without fever or signs of systemic toxicity. ? ?Exam suggest small easily drainable perirectal abscess. ? ?Patient consented to drainage.  I&D performed without difficulty.  Patient tolerated well.  Small amount of purulent material was expressed without difficulty. ? ?Patient understands need for close outpatient follow-up.  Strict return cautions given and understood. ? ?Additional history obtained: ? ?External records from outside sources obtained and reviewed including prior ED visits and prior Inpatient records.  ? ?Medicines ordered: ? ?I ordered medication including lidocaine with epi for I&D procedure ?Reevaluation of the patient after these medicines showed that the patient: improved ? ? ? ? ?Problem List / ED Course: ? ?Perirectal abscess ? ? ?Reevaluation: ? ?After the interventions noted above, I reevaluated the patient and found that they have: improved ? ? ?Disposition: ? ?After consideration of the diagnostic results and the patients response to treatment, I feel that the patent would benefit from close outpatient follow-up.  ? ? ? ? ? ? ? ? ?Final Clinical Impression(s) / ED Diagnoses ?Final diagnoses:  ?Perirectal abscess  ? ? ?Rx / DC Orders ?ED  Discharge Orders   ? ?      Ordered  ?  amoxicillin-clavulanate (AUGMENTIN) 875-125 MG tablet  Every 12 hours       ? 02/06/22 1820  ? ?  ?  ? ?  ? ? ?  ?Wynetta Fines, MD ?02/06/22 1829 ? ?

## 2022-02-06 NOTE — ED Triage Notes (Signed)
Patient coming from home, complaint of abscess in groin area for several days, worse today. VSS.  ?

## 2022-02-06 NOTE — ED Provider Triage Note (Signed)
Emergency Medicine Provider Triage Evaluation Note ? ?[name] , a 51 y.o. male  was evaluated in triage.  Pt complains of abscess to the left perianal area.  Patient reports that he noticed abscess "a few weeks" ago.  Patient reports that since this time it has been progressively worsening, growing in size.  Patient presents today for evaluation of abscess.  Patient denies any fevers, nausea, vomiting, body aches or chills. ? ?Review of Systems  ?Positive:  ?Negative:  ? ?Physical Exam  ?BP (!) 141/83 (BP Location: Right Arm)   Pulse 99   Temp 99.3 ?F (37.4 ?C) (Oral)   Resp 16   SpO2 100%  ?Gen:   Awake, no distress   ?Resp:  Normal effort  ?MSK:   Moves extremities without difficulty  ?Other:  2 inch abscess noted to patient left perianal area.  No drainage. ? ?Medical Decision Making  ?Medically screening exam initiated at 4:38 PM.  Appropriate orders placed.  Gunnard Dorrance was informed that the remainder of the evaluation will be completed by another provider, this initial triage assessment does not replace that evaluation, and the importance of remaining in the ED until their evaluation is complete. ? ? ?  ?Al Decant, PA-C ?02/06/22 1640 ? ?

## 2022-06-15 ENCOUNTER — Emergency Department (HOSPITAL_COMMUNITY): Payer: Self-pay

## 2022-06-15 ENCOUNTER — Other Ambulatory Visit: Payer: Self-pay

## 2022-06-15 ENCOUNTER — Emergency Department (HOSPITAL_COMMUNITY)
Admission: EM | Admit: 2022-06-15 | Discharge: 2022-06-15 | Disposition: A | Discharge status: Home / Self Care | Payer: Self-pay | Attending: Emergency Medicine | Admitting: Emergency Medicine

## 2022-06-15 ENCOUNTER — Encounter (HOSPITAL_COMMUNITY): Payer: Self-pay

## 2022-06-15 DIAGNOSIS — J069 Acute upper respiratory infection, unspecified: Secondary | ICD-10-CM | POA: Insufficient documentation

## 2022-06-15 DIAGNOSIS — Z20822 Contact with and (suspected) exposure to covid-19: Secondary | ICD-10-CM | POA: Insufficient documentation

## 2022-06-15 LAB — SARS CORONAVIRUS 2 BY RT PCR: SARS Coronavirus 2 by RT PCR: NEGATIVE

## 2022-06-15 MED ORDER — IBUPROFEN 800 MG PO TABS
800.0000 mg | ORAL_TABLET | Freq: Once | ORAL | Status: AC
  Administered 2022-06-15: 800 mg via ORAL
  Filled 2022-06-15: qty 1

## 2022-06-15 MED ORDER — BENZONATATE 100 MG PO CAPS: 100.0000 mg | ORAL_CAPSULE | Freq: Three times a day (TID) | ORAL | 0 refills | Status: DC

## 2022-06-15 NOTE — Discharge Instructions (Addendum)
You do not have COVID today.  I have sent a medication called benzonatate to your pharmacy.  This is for your cough.  Otherwise, you may treat your symptoms with over-the-counter medications.  Mucinex is a good option for congestion Ibuprofen and Tylenol are good options for body aches and fevers.  If you would prefer, you may use Tylenol cold and flu or DayQuil instead of normal Tylenol.  Return with any shortness of breath or chest pain.  Otherwise follow-up with a PCP.  It was a pleasure to meet you and I hope that you feel better!

## 2022-06-15 NOTE — ED Provider Notes (Signed)
[name] EMERGENCY DEPARTMENT Provider Note   CSN: [id number] Arrival date & time: 06/15/22  8756     History  Chief Complaint  Patient presents with   URI    Shawn [name] is a 51 y.o. male presenting with URI symptoms since Tuesday.  No shortness of breath or chest pain.    URI Presenting symptoms: congestion, fatigue, rhinorrhea and sore throat        Home Medications Prior to Admission medications   Medication Sig Start Date End Date Taking? Authorizing Provider  amoxicillin-clavulanate (AUGMENTIN) 875-125 MG tablet Take 1 tablet by mouth 2 (two) times daily. One po bid x 7 days 12/18/21   Dartha Lodge, PA-C  amoxicillin-clavulanate (AUGMENTIN) 875-125 MG tablet Take 1 tablet by mouth every 12 (twelve) hours. 02/06/22   Wynetta Fines, MD  cyclobenzaprine (FLEXERIL) 10 MG tablet Take 1 tablet (10 mg total) by mouth 3 (three) times daily. 01/07/21   Liberty Handy, PA-C  dexamethasone (DECADRON) 4 MG tablet Take 4 mg by mouth 2 (two) times daily.  01/18/20   [provider]  diphenhydrAMINE (SOMINEX) 25 MG tablet Take 25 mg by mouth at bedtime as needed for sleep.    [provider]  HYDROcodone-acetaminophen (NORCO/VICODIN) 5-325 MG tablet Take 2 tablets by mouth every 6 (six) hours as needed for severe pain. Patient not taking: Reported on 02/09/2020 12/07/17   Mesner, Barbara Cower, MD  multivitamin (ONE-A-DAY MEN'S) TABS tablet Take 1 tablet by mouth daily.    [provider]  mupirocin cream (BACTROBAN) 2 % Apply 1 application topically 2 (two) times daily. 02/09/20   Lorelee New, PA-C  mupirocin nasal ointment (BACTROBAN) 2 % Apply in each nostril daily Patient not taking: Reported on 02/09/2020 12/07/17   Mesner, Barbara Cower, MD  Omega-3 Fatty Acids (FISH OIL PO) Take 1 capsule by mouth daily.     [provider]  traMADol (ULTRAM) 50 MG tablet Take 50 mg by mouth every 6 (six) hours as needed (for pain).  01/18/20    [provider]  vitamin C (ASCORBIC ACID) 500 MG tablet Take 1,000 mg by mouth daily as needed (for immune system support).     [provider]  VITAMIN E PO Take 1 capsule by mouth daily.    [provider]      Allergies    Patient has no known allergies.    Review of Systems   Review of Systems  Constitutional:  Positive for fatigue.  HENT:  Positive for congestion, rhinorrhea and sore throat.   Respiratory:  Negative for shortness of breath.   Cardiovascular:  Negative for chest pain.  Gastrointestinal:  Negative for diarrhea and vomiting.    Physical Exam Updated Vital Signs BP (!) 150/92 (BP Location: Right Arm)   Pulse 83   Temp 99.8 F (37.7 C) (Oral)   Resp 18   Ht 5\' 11"  (1.803 m)   Wt 90.7 kg   SpO2 100%   BMI 27.89 kg/m  Physical Exam Vitals and nursing note reviewed.  Constitutional:      Appearance: Normal appearance.  HENT:     Head: Normocephalic and atraumatic.     Mouth/Throat:     Comments: Airway clear, tolerating secretions.  Erythema, no exudate Eyes:     General: No scleral icterus.    Conjunctiva/sclera: Conjunctivae normal.  Pulmonary:     Effort: Pulmonary effort is normal. No respiratory distress.  Skin:    Findings: No rash.  Neurological:     Mental Status: He is alert.  Psychiatric:        Mood and Affect: Mood normal.     ED Results / Procedures / Treatments   Labs (all labs ordered are listed, but only abnormal results are displayed) Labs Reviewed  SARS CORONAVIRUS 2 BY RT PCR    EKG None  Radiology DG Chest 2 View  Result Date: 06/15/2022 CLINICAL DATA:  Cough EXAM: CHEST - 2 VIEW COMPARISON:  Chest radiograph 11/02/2010 FINDINGS: Stable cardiac and mediastinal contours. No consolidative pulmonary opacities. No pleural effusion or pneumothorax. Metallic artifact over the upper chest. IMPRESSION: No active cardiopulmonary disease. Electronically Signed   By: Annia Belt M.D.   On: 06/15/2022  09:47    Procedures Procedures   Medications Ordered in ED Medications - No data to display  ED Course/ Medical Decision Making/ A&P                           Medical Decision Making Amount and/or Complexity of Data Reviewed Radiology: ordered.   51 year old male presented today with URI symptoms.  Since Tuesday.  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  Imaging: X-ray ordered, viewed and interpreted by me.  I agree with the radiologist that this is negative.  Testing: COVID-negative  MDM/disposition: Patient presenting with URI symptoms.  Chest x-ray and COVID-negative.  Likely viral URI.  Sent benzonatate to his pharmacy and otherwise he may use over-the-counter medications.  He is agreeable.  Work note attached  Final Clinical Impression(s) / ED Diagnoses Final diagnoses:  Viral URI with cough    Rx / DC Orders ED Discharge Orders          Ordered    benzonatate (TESSALON) 100 MG capsule  Every 8 hours        06/15/22 1105           Results and diagnoses were explained to the patient. Return precautions discussed in full. Patient had no additional questions and expressed complete understanding.   This chart was dictated using voice recognition software.  Despite best efforts to proofread,  errors can occur which can change the documentation meaning.    Saddie Benders, PA-C 06/15/22 1107    Arby Barrette, MD 06/16/22 [phone]

## 2022-06-15 NOTE — ED Triage Notes (Signed)
Reports cough and runny nose.  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  Reports sputum is yellow tinge.  Denies CP or SOB.

## 2022-09-12 ENCOUNTER — Emergency Department (HOSPITAL_COMMUNITY)
Admission: EM | Admit: 2022-09-12 | Discharge: 2022-09-12 | Disposition: A | Discharge status: Home / Self Care | Payer: Self-pay | Attending: Emergency Medicine | Admitting: Emergency Medicine

## 2022-09-12 ENCOUNTER — Encounter (HOSPITAL_COMMUNITY): Payer: Self-pay | Admitting: Emergency Medicine

## 2022-09-12 DIAGNOSIS — L0501 Pilonidal cyst with abscess: Secondary | ICD-10-CM | POA: Insufficient documentation

## 2022-09-12 DIAGNOSIS — I10 Essential (primary) hypertension: Secondary | ICD-10-CM | POA: Insufficient documentation

## 2022-09-12 DIAGNOSIS — Z8673 Personal history of transient ischemic attack (TIA), and cerebral infarction without residual deficits: Secondary | ICD-10-CM | POA: Insufficient documentation

## 2022-09-12 MED ORDER — DOXYCYCLINE HYCLATE 100 MG PO CAPS: 100.0000 mg | ORAL_CAPSULE | Freq: Two times a day (BID) | ORAL | 0 refills | Status: DC

## 2022-09-12 NOTE — Discharge Instructions (Addendum)
I have written a prescription for you for an antibiotic of doxycycline.  You will need to follow-up with a general surgeon  Do warm sitz bath's.  You can google this or read the attached information

## 2022-09-12 NOTE — ED Triage Notes (Signed)
Pt reports lump on his lower back for awhile but has now become sore and draining.

## 2022-09-12 NOTE — ED Provider Notes (Signed)
Shawn [name]   CSN: [id number] Arrival date & time: 09/12/22  1621     History  Chief Complaint  Patient presents with   Abscess    Shawn [name] is a 51 y.o. male.   Abscess Patient is a 51 year old male with past medical history significant for stroke and hypertension  He is present emergency room today with complaints of lump between the top of his butt cheeks.  He states he has had intermittent issues with this over the past few years.  States that he has not had any fevers or chills no pain with defecation.  No other associate symptoms  He states that over the past 3 to 4 days it has been swollen and more tender here.  States the pain is 5/10.      Home Medications Prior to Admission medications   Medication Sig Start Date End Date Taking? Authorizing Provider  doxycycline (VIBRAMYCIN) 100 MG capsule Take 1 capsule (100 mg total) by mouth 2 (two) times daily. 09/12/22  Yes Michayla Mcneil S, PA  amoxicillin-clavulanate (AUGMENTIN) 875-125 MG tablet Take 1 tablet by mouth 2 (two) times daily. One po bid x 7 days 12/18/21   Dartha Lodge, PA-C  amoxicillin-clavulanate (AUGMENTIN) 875-125 MG tablet Take 1 tablet by mouth every 12 (twelve) hours. 02/06/22   Wynetta Fines, MD  benzonatate (TESSALON) 100 MG capsule Take 1 capsule (100 mg total) by mouth every 8 (eight) hours. 06/15/22   Redwine, Madison A, PA-C  cyclobenzaprine (FLEXERIL) 10 MG tablet Take 1 tablet (10 mg total) by mouth 3 (three) times daily. 01/07/21   Liberty Handy, PA-C  dexamethasone (DECADRON) 4 MG tablet Take 4 mg by mouth 2 (two) times daily.  01/18/20   [provider]  diphenhydrAMINE (SOMINEX) 25 MG tablet Take 25 mg by mouth at bedtime as needed for sleep.    [provider]  HYDROcodone-acetaminophen (NORCO/VICODIN) 5-325 MG tablet Take 2 tablets by mouth every 6 (six) hours as needed for severe pain. Patient not taking:  Reported on 02/09/2020 12/07/17   Mesner, Barbara Cower, MD  multivitamin (ONE-A-DAY MEN'S) TABS tablet Take 1 tablet by mouth daily.    [provider]  mupirocin cream (BACTROBAN) 2 % Apply 1 application topically 2 (two) times daily. 02/09/20   Lorelee New, PA-C  mupirocin nasal ointment (BACTROBAN) 2 % Apply in each nostril daily Patient not taking: Reported on 02/09/2020 12/07/17   Mesner, Barbara Cower, MD  Omega-3 Fatty Acids (FISH OIL PO) Take 1 capsule by mouth daily.     [provider]  traMADol (ULTRAM) 50 MG tablet Take 50 mg by mouth every 6 (six) hours as needed (for pain).  01/18/20   [provider]  vitamin C (ASCORBIC ACID) 500 MG tablet Take 1,000 mg by mouth daily as needed (for immune system support).     [provider]  VITAMIN E PO Take 1 capsule by mouth daily.    [provider]      Allergies    Patient has no known allergies.    Review of Systems   Review of Systems  Physical Exam Updated Vital Signs BP (!) 126/91   Pulse 90   Temp 98.6 F (37 C) (Oral)   Resp 15   Ht 5\' 11"  (1.803 m)   Wt 90.7 kg   SpO2 100%   BMI 27.89 kg/m  Physical Exam Vitals and nursing [name] reviewed.  Constitutional:  General: He is not in acute distress.    Appearance: Normal appearance. He is not ill-appearing.  HENT:     Head: Normocephalic and atraumatic.  Eyes:     General: No scleral icterus.       Right eye: No discharge.        Left eye: No discharge.     Conjunctiva/sclera: Conjunctivae normal.  Pulmonary:     Effort: Pulmonary effort is normal.     Breath sounds: No stridor.  Skin:    Comments: Small pilonidal abscess located in gluteal cleft.  No cellulitic changes.  Neurological:     Mental Status: He is alert and oriented to person, place, and time. Mental status is at baseline.     ED Results / Procedures / Treatments   Labs (all labs ordered are listed, but only abnormal results are displayed) Labs Reviewed - No  data to display  EKG None  Radiology No results found.  Procedures Procedures    Medications Ordered in ED Medications - No data to display  ED Course/ Medical Decision Making/ A&P                           Medical Decision Making Risk Prescription drug management.   Patient is a 51 year old male with past medical history significant for stroke and hypertension  He is present emergency room today with complaints of lump between the top of his butt cheeks.  He states he has had intermittent issues with this over the past few years.  States that he has not had any fevers or chills no pain with defecation.  No other associate symptoms  He states that over the past 3 to 4 days it has been swollen and more tender here.  States the pain is 5/10.  Tylenol abscess noted between superior gluteal cleft.  Recommended warm compress, doxycycline and follow-up with general surgery.  Initially decision making amputation and he is agreeable to this plan and will follow-up with general surgery.   Final Clinical Impression(s) / ED Diagnoses Final diagnoses:  Pilonidal abscess    Rx / DC Orders ED Discharge Orders          Ordered    doxycycline (VIBRAMYCIN) 100 MG capsule  2 times daily        09/12/22 1711              Gailen Shelter, [place] 09/12/22 1715    Virgina Norfolk, DO 09/12/22 2224

## 2023-04-25 ENCOUNTER — Other Ambulatory Visit: Payer: Self-pay

## 2023-04-25 ENCOUNTER — Encounter (HOSPITAL_COMMUNITY): Payer: Self-pay | Admitting: Emergency Medicine

## 2023-04-25 ENCOUNTER — Emergency Department (HOSPITAL_COMMUNITY)
Admission: EM | Admit: 2023-04-25 | Discharge: 2023-04-25 | Disposition: A | Discharge status: Home / Self Care | Payer: Self-pay | Attending: Emergency Medicine | Admitting: Emergency Medicine

## 2023-04-25 DIAGNOSIS — Z758 Other problems related to medical facilities and other health care: Secondary | ICD-10-CM

## 2023-04-25 DIAGNOSIS — Z749 Problem related to care provider dependency, unspecified: Secondary | ICD-10-CM | POA: Insufficient documentation

## 2023-04-25 NOTE — ED Provider Notes (Signed)
[facility] AT [facility] Provider Note   CSN: [id number] Arrival date & time: 04/25/23  1412     History  No chief complaint on file.   Shawn [name] is a 52 y.o. male.  52 yo M with a chief complaint of needing disability paperwork filled out as well as he would like to know what his blood type is.  He also would like referral back to general surgery to be evaluated for his pilonidal abscess.        Home Medications Prior to Admission medications   Medication Sig Start Date End Date Taking? Authorizing Provider  amoxicillin-clavulanate (AUGMENTIN) 875-125 MG tablet Take 1 tablet by mouth 2 (two) times daily. One po bid x 7 days 12/18/21   Dartha Lodge, PA-C  amoxicillin-clavulanate (AUGMENTIN) 875-125 MG tablet Take 1 tablet by mouth every 12 (twelve) hours. 02/06/22   Wynetta Fines, MD  benzonatate (TESSALON) 100 MG capsule Take 1 capsule (100 mg total) by mouth every 8 (eight) hours. 06/15/22   Redwine, Madison A, PA-C  cyclobenzaprine (FLEXERIL) 10 MG tablet Take 1 tablet (10 mg total) by mouth 3 (three) times daily. 01/07/21   Liberty Handy, PA-C  dexamethasone (DECADRON) 4 MG tablet Take 4 mg by mouth 2 (two) times daily.  01/18/20   [provider]  diphenhydrAMINE (SOMINEX) 25 MG tablet Take 25 mg by mouth at bedtime as needed for sleep.    [provider]  doxycycline (VIBRAMYCIN) 100 MG capsule Take 1 capsule (100 mg total) by mouth 2 (two) times daily. 09/12/22   Curatolo, Adam, DO  HYDROcodone-acetaminophen (NORCO/VICODIN) 5-325 MG tablet Take 2 tablets by mouth every 6 (six) hours as needed for severe pain. Patient not taking: Reported on 02/09/2020 12/07/17   Mesner, Barbara Cower, MD  multivitamin (ONE-A-DAY MEN'S) TABS tablet Take 1 tablet by mouth daily.    [provider]  mupirocin cream (BACTROBAN) 2 % Apply 1 application topically 2 (two) times daily. 02/09/20   Lorelee New, PA-C  mupirocin nasal  ointment (BACTROBAN) 2 % Apply in each nostril daily Patient not taking: Reported on 02/09/2020 12/07/17   Mesner, Barbara Cower, MD  Omega-3 Fatty Acids (FISH OIL PO) Take 1 capsule by mouth daily.     [provider]  traMADol (ULTRAM) 50 MG tablet Take 50 mg by mouth every 6 (six) hours as needed (for pain).  01/18/20   [provider]  vitamin C (ASCORBIC ACID) 500 MG tablet Take 1,000 mg by mouth daily as needed (for immune system support).     [provider]  VITAMIN E PO Take 1 capsule by mouth daily.    [provider]      Allergies    Patient has no known allergies.    Review of Systems   Review of Systems  Physical Exam Updated Vital Signs BP (!) 142/92 (BP Location: Right Arm)   Pulse 72   Temp 98 F (36.7 C) (Oral)   Resp 14   Ht 5\' 11"  (1.803 m)   Wt 90.3 kg   SpO2 100%   BMI 27.75 kg/m  Physical Exam Vitals and nursing note reviewed.  Constitutional:      Appearance: He is well-developed.  HENT:     Head: Normocephalic and atraumatic.  Eyes:     Pupils: Pupils are equal, round, and reactive to light.  Neck:     Vascular: No JVD.  Cardiovascular:     Rate and Rhythm:  Normal rate and regular rhythm.     Heart sounds: No murmur heard.    No friction rub. No gallop.  Pulmonary:     Effort: No respiratory distress.     Breath sounds: No wheezing.  Abdominal:     General: There is no distension.     Tenderness: There is no abdominal tenderness. There is no guarding or rebound.  Musculoskeletal:        General: Normal range of motion.     Cervical back: Normal range of motion and neck supple.  Skin:    Coloration: Skin is not pale.     Findings: No rash.  Neurological:     Mental Status: He is alert and oriented to person, place, and time.  Psychiatric:        Behavior: Behavior normal.     ED Results / Procedures / Treatments   Labs (all labs ordered are listed, but only abnormal results are displayed) Labs Reviewed - No  data to display  EKG None  Radiology No results found.  Procedures Procedures    Medications Ordered in ED Medications - No data to display  ED Course/ Medical Decision Making/ A&P                             Medical Decision Making  52 yo M with a chief complaints of wanting disability paperwork filled out, when he noted his blood type is, wanting referral to general surgery.  I had a long discussion with him about establishing care with a primary care provider.  Offered multiple different options to try and find 1.  Will consult social work here to try and reach him on the phone to help him as well.  Given information for general surgery to follow-up.  I discussed our department policy that we do not fill out disability paperwork in the emergency department.  Encouraged him to try an urgent care if he needs it filled out more urgently.  He has no acute change to his pilonidal abscess.  4:06 PM:  I have discussed the diagnosis/risks/treatment options with the patient.  Evaluation and diagnostic testing in the emergency department does not suggest an emergent condition requiring admission or immediate intervention beyond what has been performed at this time.  They will follow up with PCP. We also discussed returning to the ED immediately if new or worsening sx occur. We discussed the sx which are most concerning (e.g., sudden worsening pain, fever, inability to tolerate by mouth) that necessitate immediate return. Medications administered to the patient during their visit and any new prescriptions provided to the patient are listed below.  Medications given during this visit Medications - No data to display   The patient appears reasonably screen and/or stabilized for discharge and I doubt any other medical condition or other [facility] requiring further screening, evaluation, or treatment in the ED at this time prior to discharge.          Final Clinical Impression(s) / ED  Diagnoses Final diagnoses:  Does not have primary care provider    Rx / DC Orders ED Discharge Orders     None         Melene Plan, DO 04/25/23 1606

## 2023-04-25 NOTE — Discharge Instructions (Addendum)
Call the general surgery office tomorrow and try to schedule an appointment to see them in the office.  Everyone would benefit from having a primary care physician.  You can try the hotline number on the back of your insurance card or you can try the hotline number in this paperwork.  I have also placed an order in for the social worker to try and give you a call on the phone.  Return for worsening pain or swelling or fever from your pilonidal abscess.

## 2023-04-25 NOTE — ED Triage Notes (Signed)
Patient states he informed his work that he needs a leave of absence due to stress. His work states he needs this paperwork filled out. Patient also wanting to know his blood type. Patient states he is also in the process of trying to get insurance to cover surgery for his pilonidal abscess.

## 2023-05-08 ENCOUNTER — Telehealth: Payer: Self-pay

## 2023-05-08 ENCOUNTER — Ambulatory Visit: Payer: Self-pay | Admitting: Physician Assistant

## 2023-05-08 NOTE — Telephone Encounter (Signed)
Per Florentina Addison patient called and stated he was out of town after confirming appointment 05/07/2023 and would not be able to make his appointment. Per Lillia Abed he can reschedule this one time and will be advised that he must make his next appointment or will not be able to schedule any future appointment. Patient wanted an appointment tomorrow but unable to fit him on the schedule for tomorrow so patient does not wish to proceed with future appointment.
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
## 2023-05-17 NOTE — Telephone Encounter (Signed)
Pt called back and was rescheduled for 05/29/23
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
## 2023-05-29 ENCOUNTER — Ambulatory Visit: Payer: Self-pay | Admitting: Physician Assistant

## 2023-05-30 ENCOUNTER — Encounter: Payer: Self-pay | Admitting: Physician Assistant

## 2023-05-30 ENCOUNTER — Ambulatory Visit: Payer: BC Managed Care – PPO | Admitting: Physician Assistant

## 2023-05-30 VITALS — BP 148/82 | HR 82 | Temp 98.4°F | Resp 20 | Ht 71.0 in | Wt 201.0 lb

## 2023-05-30 DIAGNOSIS — Z125 Encounter for screening for malignant neoplasm of prostate: Secondary | ICD-10-CM | POA: Diagnosis not present

## 2023-05-30 DIAGNOSIS — I10 Essential (primary) hypertension: Secondary | ICD-10-CM | POA: Diagnosis not present

## 2023-05-30 DIAGNOSIS — L0501 Pilonidal cyst with abscess: Secondary | ICD-10-CM | POA: Diagnosis not present

## 2023-05-30 DIAGNOSIS — K625 Hemorrhage of anus and rectum: Secondary | ICD-10-CM | POA: Diagnosis not present

## 2023-05-30 DIAGNOSIS — M5489 Other dorsalgia: Secondary | ICD-10-CM

## 2023-05-30 DIAGNOSIS — Z8673 Personal history of transient ischemic attack (TIA), and cerebral infarction without residual deficits: Secondary | ICD-10-CM

## 2023-05-30 DIAGNOSIS — Z720 Tobacco use: Secondary | ICD-10-CM | POA: Insufficient documentation

## 2023-05-30 DIAGNOSIS — R0600 Dyspnea, unspecified: Secondary | ICD-10-CM

## 2023-05-30 DIAGNOSIS — Z1159 Encounter for screening for other viral diseases: Secondary | ICD-10-CM

## 2023-05-30 DIAGNOSIS — Z114 Encounter for screening for human immunodeficiency virus [HIV]: Secondary | ICD-10-CM

## 2023-05-30 DIAGNOSIS — Z0183 Encounter for blood typing: Secondary | ICD-10-CM

## 2023-05-30 LAB — CBC WITH DIFFERENTIAL/PLATELET
Basophils Absolute: 0 10*3/uL (ref 0.0–0.1)
Basophils Relative: 0.7 % (ref 0.0–3.0)
Eosinophils Absolute: 0.1 10*3/uL (ref 0.0–0.7)
Eosinophils Relative: 1.3 % (ref 0.0–5.0)
HCT: 35 % — ABNORMAL LOW (ref 39.0–52.0)
Hemoglobin: 10 g/dL — ABNORMAL LOW (ref 13.0–17.0)
Lymphocytes Relative: 26.8 % (ref 12.0–46.0)
Lymphs Abs: 1.5 10*3/uL (ref 0.7–4.0)
MCHC: 28.5 g/dL — ABNORMAL LOW (ref 30.0–36.0)
MCV: 62.9 fl — ABNORMAL LOW (ref 78.0–100.0)
Monocytes Absolute: 0.6 10*3/uL (ref 0.1–1.0)
Monocytes Relative: 10.3 % (ref 3.0–12.0)
Neutro Abs: 3.3 10*3/uL (ref 1.4–7.7)
Neutrophils Relative %: 60.9 % (ref 43.0–77.0)
Platelets: 270 10*3/uL (ref 150.0–400.0)
RBC: 5.57 Mil/uL (ref 4.22–5.81)
RDW: 18.2 % — ABNORMAL HIGH (ref 11.5–15.5)
WBC: 5.5 10*3/uL (ref 4.0–10.5)

## 2023-05-30 LAB — LIPID PANEL
Cholesterol: 170 mg/dL (ref 0–200)
HDL: 45.7 mg/dL (ref >39.00)
LDL Cholesterol: 108 mg/dL — ABNORMAL HIGH (ref 0–99)
NonHDL: 124.58
Total CHOL/HDL Ratio: 4
Triglycerides: 85 mg/dL (ref 0.0–149.0)
VLDL: 17 mg/dL (ref 0.0–40.0)

## 2023-05-30 LAB — COMPREHENSIVE METABOLIC PANEL
ALT: 11 U/L (ref 0–53)
AST: 17 U/L (ref 0–37)
Albumin: 4.1 g/dL (ref 3.5–5.2)
Alkaline Phosphatase: 63 U/L (ref 39–117)
BUN: 11 mg/dL (ref 6–23)
CO2: 31 mEq/L (ref 19–32)
Calcium: 9 mg/dL (ref 8.4–10.5)
Chloride: 103 mEq/L (ref 96–112)
Creatinine, Ser: 0.82 mg/dL (ref 0.40–1.50)
GFR: 102.38 mL/min (ref >60.00)
Glucose, Bld: 114 mg/dL — ABNORMAL HIGH (ref 70–99)
Potassium: 4.1 mEq/L (ref 3.5–5.1)
Sodium: 138 mEq/L (ref 135–145)
Total Bilirubin: 0.5 mg/dL (ref 0.2–1.2)
Total Protein: 6.9 g/dL (ref 6.0–8.3)

## 2023-05-30 LAB — HEMOGLOBIN A1C: Hgb A1c MFr Bld: 5.9 % (ref 4.6–6.5)

## 2023-05-30 LAB — PSA: PSA: 2.25 ng/mL (ref 0.10–4.00)

## 2023-05-30 MED ORDER — AMLODIPINE BESYLATE 5 MG PO TABS: 5.0000 mg | ORAL_TABLET | Freq: Every day | ORAL | 1 refills | Status: DC
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
## 2023-05-30 NOTE — Assessment & Plan Note (Signed)
Reports smoking approximately half a pack of cigarettes daily. -Discuss smoking cessation strategies at next visit.
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
## 2023-05-30 NOTE — Assessment & Plan Note (Signed)
Elevated in office Given hx of CVA, starting amlodipine 5 mg today Advised pt check BP at home Fu 1 mo

## 2023-05-30 NOTE — Assessment & Plan Note (Signed)
History of rectal bleeding with defecation. -Refer to gastroenterology for evaluation.

## 2023-05-30 NOTE — Addendum Note (Signed)
Addended byAlfredia Ferguson on: 05/30/2023 04:17 PM   Modules accepted: Orders

## 2023-05-30 NOTE — Assessment & Plan Note (Signed)
Chronic issue with recurrent abscesses. -Refer to general surgery for evaluation and possible intervention.

## 2023-05-30 NOTE — Assessment & Plan Note (Signed)
History of stroke in 2011. -Resume daily baby aspirin. -Order labs to check cholesterol levels and consider starting a statin for secondary prevention of stroke.

## 2023-05-31 LAB — ABO AND RH

## 2023-05-31 LAB — HIV ANTIBODY (ROUTINE TESTING W REFLEX): HIV 1&2 Ab, 4th Generation: NONREACTIVE

## 2023-05-31 LAB — HEPATITIS C ANTIBODY: Hepatitis C Ab: NONREACTIVE

## 2023-07-05 NOTE — Progress Notes (Unsigned)
Established patient visit   Patient: Shawn [name]   DOB: Dec 21, 1970   52 y.o. Male  MRN: [id number] Visit Date: 07/08/2023  Today's healthcare provider: Alfredia Ferguson, PA-C   No chief complaint on file.  Subjective    HPI  Hypertension, follow-up  BP Readings from Last 3 Encounters:  05/30/23 (!) 148/82  04/25/23 138/88  09/12/22 (!) 126/91   Wt Readings from Last 3 Encounters:  05/30/23 201 lb (91.2 kg)  04/25/23 199 lb (90.3 kg)  09/12/22 199 lb 15.3 oz (90.7 kg)     He was last seen for hypertension 1 month ago.  BP at that visit was 148/82. Management since that visit includes starting amlodipine 5 mg.  He reports {excellent/good/fair/poor:19665} compliance with treatment. He {is/is not:9024} having side effects. {document side effects if present:1} He is following a {diet:21022986} diet. He {is/is not:9024} exercising. He {does/does not:200015} smoke.  Use of agents associated with hypertension: {bp agents assoc with hypertension:511::"none"}.   Outside blood pressures are {***enter patient reported home BP readings, or 'not being checked':1}. Symptoms: {Yes/No:20286} chest pain {Yes/No:20286} chest pressure  {Yes/No:20286} palpitations {Yes/No:20286} syncope  {Yes/No:20286} dyspnea {Yes/No:20286} orthopnea  {Yes/No:20286} paroxysmal nocturnal dyspnea {Yes/No:20286} lower extremity edema   Pertinent labs Lab Results  Component Value Date   CHOL 170 05/30/2023   HDL 45.70 05/30/2023   LDLCALC 108 (H) 05/30/2023   TRIG 85.0 05/30/2023   CHOLHDL 4 05/30/2023   Lab Results  Component Value Date   NA 138 05/30/2023   K 4.1 05/30/2023   CREATININE 0.82 05/30/2023   GFRNONAA >60 02/15/2017   GLUCOSE 114 (H) 05/30/2023     The ASCVD Risk score (Arnett DK, et al., 2019) failed to calculate for the following reasons:   The patient has a prior MI or stroke  diagnosis  ---------------------------------------------------------------------------------------------------   Medications: Outpatient Medications Prior to Visit  Medication Sig   amLODipine (NORVASC) 5 MG tablet Take 1 tablet (5 mg total) by mouth daily.   multivitamin (ONE-A-DAY MEN'S) TABS tablet Take 1 tablet by mouth daily.   Omega-3 Fatty Acids (FISH OIL PO) Take 1 capsule by mouth daily.    No facility-administered medications prior to visit.    Review of Systems {Insert previous labs (optional):23779} {See past labs  Heme  Chem  Endocrine  Serology  Results Review (optional):1}   Objective    There were no vitals taken for this visit. {Insert last BP/Wt (optional):23777}{See vitals history (optional):1}  Physical Exam  ***  No results found for any visits on 07/08/23.  Assessment & Plan     ***  No follow-ups on file.      {provider attestation***:1}   Alfredia Ferguson, PA-C  [facility] Primary Care at [facility] [phone] (phone) [phone] (fax)  [facility]

## 2023-07-08 ENCOUNTER — Ambulatory Visit (INDEPENDENT_AMBULATORY_CARE_PROVIDER_SITE_OTHER): Payer: BC Managed Care – PPO | Admitting: Physician Assistant

## 2023-07-08 ENCOUNTER — Encounter: Payer: Self-pay | Admitting: Physician Assistant

## 2023-07-08 VITALS — BP 144/82 | HR 87 | Temp 98.6°F | Ht 71.0 in | Wt 203.0 lb

## 2023-07-08 DIAGNOSIS — L709 Acne, unspecified: Secondary | ICD-10-CM | POA: Diagnosis not present

## 2023-07-08 DIAGNOSIS — I10 Essential (primary) hypertension: Secondary | ICD-10-CM | POA: Diagnosis not present

## 2023-07-08 MED ORDER — AMLODIPINE BESYLATE 5 MG PO TABS: 5.0000 mg | ORAL_TABLET | Freq: Every day | ORAL | 1 refills | Status: AC

## 2023-07-08 NOTE — Assessment & Plan Note (Signed)
Discussion on consequences of untreated htn especially with his history of CVA  Stressing he should start exercising and start amlodipine 5 mg. Resent to pharmacy. F/u 1 mo

## 2023-07-15 ENCOUNTER — Encounter: Payer: Self-pay | Admitting: Gastroenterology

## 2023-07-18 ENCOUNTER — Emergency Department (HOSPITAL_COMMUNITY)
Admission: EM | Admit: 2023-07-18 | Discharge: 2023-07-18 | Disposition: A | Discharge status: Home / Self Care | Payer: BC Managed Care – PPO | Attending: Emergency Medicine | Admitting: Emergency Medicine

## 2023-07-18 ENCOUNTER — Other Ambulatory Visit: Payer: Self-pay

## 2023-07-18 DIAGNOSIS — L0501 Pilonidal cyst with abscess: Secondary | ICD-10-CM | POA: Insufficient documentation

## 2023-07-18 DIAGNOSIS — I10 Essential (primary) hypertension: Secondary | ICD-10-CM | POA: Insufficient documentation

## 2023-07-18 DIAGNOSIS — Z23 Encounter for immunization: Secondary | ICD-10-CM | POA: Insufficient documentation

## 2023-07-18 DIAGNOSIS — Z79899 Other long term (current) drug therapy: Secondary | ICD-10-CM | POA: Diagnosis not present

## 2023-07-18 MED ORDER — LIDOCAINE-EPINEPHRINE (PF) 2 %-1:200000 IJ SOLN
10.0000 mL | Freq: Once | INTRAMUSCULAR | Status: AC
  Administered 2023-07-18: 10 mL via INTRADERMAL
  Filled 2023-07-18: qty 20

## 2023-07-18 MED ORDER — TETANUS-DIPHTH-ACELL PERTUSSIS 5-2.5-18.5 LF-MCG/0.5 IM SUSY
0.5000 mL | PREFILLED_SYRINGE | Freq: Once | INTRAMUSCULAR | Status: AC
  Administered 2023-07-18: 0.5 mL via INTRAMUSCULAR
  Filled 2023-07-18: qty 0.5

## 2023-07-18 MED ORDER — IBUPROFEN 600 MG PO TABS
600.0000 mg | ORAL_TABLET | Freq: Four times a day (QID) | ORAL | 0 refills | Status: DC | PRN
Start: 2023-07-18 — End: 2023-12-25

## 2023-07-18 MED ORDER — DOXYCYCLINE HYCLATE 100 MG PO CAPS
100.0000 mg | ORAL_CAPSULE | Freq: Two times a day (BID) | ORAL | 0 refills | Status: DC
Start: 2023-07-18 — End: 2024-04-15

## 2023-07-18 NOTE — ED Triage Notes (Addendum)
Pt arrives to ED c/o abscess to lower right side of back x years that has been causing pain. Pt says that mass initially was small but has become larger. Pt reports that abscess is with purulent drainage. Pt denies blood work

## 2023-07-18 NOTE — ED Provider Notes (Signed)
[place] EMERGENCY DEPARTMENT AT [facility] Provider Note   CSN: [id number] Arrival date & time: 07/18/23  7829     History  Chief Complaint  Patient presents with   Abscess    Shawn [name] is a 52 y.o. male.  The history is provided by the patient and medical records. No language interpreter was used.  Abscess    52 year old male significant history hypertension, prior stroke, pilonidal abscess, presenting with complaints of concerns of skin abscess.  Patient states he noticed increasing pain swelling to his pilonidal site ongoing for the past week.  Pain is throbbing, persistent, worse with palpation.  No complaint of having difficulty with bowel movement no fever no abdominal pain.  Patient unsure last tetanus status.  Denies any specific trauma to the affected area.  He mention he is scheduled to have surgical intervention in November since this is recurrent.  Home Medications Prior to Admission medications   Medication Sig Start Date End Date Taking? Authorizing Provider  amLODipine (NORVASC) 5 MG tablet Take 1 tablet (5 mg total) by mouth daily. 07/08/23   Alfredia Ferguson, PA-C  multivitamin (ONE-A-DAY MEN'S) TABS tablet Take 1 tablet by mouth daily.    [provider]  Omega-3 Fatty Acids (FISH OIL PO) Take 1 capsule by mouth daily.  Patient not taking: Reported on 07/08/2023    [provider]      Allergies    Patient has no known allergies.    Review of Systems   Review of Systems  All other systems reviewed and are negative.   Physical Exam Updated Vital Signs BP (!) 149/90 (BP Location: Right Arm)   Pulse 98   Temp 98.4 F (36.9 C) (Oral)   Resp 18   Ht 5\' 11"  (1.803 m)   SpO2 100%   BMI 28.31 kg/m  Physical Exam Vitals and nursing note reviewed.  Constitutional:      General: He is not in acute distress.    Appearance: He is well-developed.  HENT:     Head: Atraumatic.  Eyes:     Conjunctiva/sclera: Conjunctivae  normal.  Abdominal:     Palpations: Abdomen is soft.     Tenderness: There is no abdominal tenderness.  Musculoskeletal:     Cervical back: Neck supple.  Skin:    Findings: No rash.     Comments: Moderate sized area of induration and fluctuant noted to the gluteal cleft tender to palpation.  Neurological:     Mental Status: He is alert.     ED Results / Procedures / Treatments   Labs (all labs ordered are listed, but only abnormal results are displayed) Labs Reviewed - No data to display  EKG None  Radiology No results found.  Procedures .Marland KitchenIncision and Drainage  Date/Time: 07/18/2023 8:03 AM  Performed by: Fayrene Helper, PA-C Authorized by: Fayrene Helper, PA-C   Consent:    Consent obtained:  Verbal   Consent given by:  Patient   Risks discussed:  Bleeding, incomplete drainage, pain and damage to other organs   Alternatives discussed:  No treatment Universal protocol:    Procedure explained and questions answered to patient or proxy's satisfaction: yes     Relevant documents present and verified: yes     Test results available : yes     Imaging studies available: yes     Required blood products, implants, devices, and special equipment available: yes     Site/side marked: yes     Immediately prior  to procedure, a time out was called: yes     Patient identity confirmed:  Verbally with patient Location:    Type:  Pilonidal cyst   Size:  6   Location:  Anogenital   Anogenital location:  Pilonidal Pre-procedure details:    Skin preparation:  Betadine Anesthesia:    Anesthesia method:  Local infiltration   Local anesthetic:  Lidocaine 1% WITH epi Procedure type:    Complexity:  Complex Procedure details:    Incision types:  Single straight   Incision depth:  Subcutaneous   Wound management:  Probed and deloculated, irrigated with saline and extensive cleaning   Drainage:  Purulent   Drainage amount:  Copious   Packing materials:  1/4 in gauze Post-procedure  details:    Procedure completion:  Tolerated with difficulty     Medications Ordered in ED Medications  Tdap (BOOSTRIX) injection 0.5 mL (0.5 mLs Intramuscular Given 07/18/23 0726)  lidocaine-EPINEPHrine (XYLOCAINE W/EPI) 2 %-1:200000 (PF) injection 10 mL (10 mLs Intradermal Given by Other 07/18/23 0102)    ED Course/ Medical Decision Making/ A&P                                 Medical Decision Making  BP (!) 149/90 (BP Location: Right Arm)   Pulse 98   Temp 98.4 F (36.9 C) (Oral)   Resp 18   Ht 5\' 11"  (1.803 m)   SpO2 100%   BMI 28.31 kg/m   32:57 AM  52 year old male significant history hypertension, prior stroke, pilonidal abscess, presenting with complaints of concerns of skin abscess.  Patient states he noticed increasing pain swelling to his pilonidal site ongoing for the past week.  Pain is throbbing, persistent, worse with palpation.  No complaint of having difficulty with bowel movement no fever no abdominal pain.  Patient unsure last tetanus status.  Denies any specific trauma to the affected area.  He mention he is scheduled to have surgical intervention in November since this is recurrent.  On exam this is a well-appearing male resting comfortably in bed appears to be in no acute discomfort.  Exam remarkable for evidence of induration and fluctuant noted to his gluteal cleft consistent with infected pilonidal cyst.  Will update tetanus, will perform I&D  8:03 AM Successful incision and drainage of large pilonidal cyst.  Copious amount of purulent discharge was expressed.  Packing was placed.  Recommend patient to return in 48 hours for recheck.  Recommend warm compress in the meantime.  Patient discharged home with antibiotic.  Imaging including ultrasound of the affected area as well as pelvic CT was considered but suspect this is more of a cutaneous abscess and I have low suspicion for deep tissue infections or Fournier gangrene.        Final Clinical  Impression(s) / ED Diagnoses Final diagnoses:  Pilonidal abscess    Rx / DC Orders ED Discharge Orders          Ordered    doxycycline (VIBRAMYCIN) 100 MG capsule  2 times daily        07/18/23 0805    ibuprofen (ADVIL) 600 MG tablet  Every 6 hours PRN        07/18/23 0805              Fayrene Helper, PA-C 07/18/23 7253    Rondel Baton, MD 07/18/23 1950

## 2023-07-18 NOTE — Discharge Instructions (Signed)
You have a large infected pilonidal cyst that was incised and drained in the ED.  Continue to apply warm moist compresses affected area several times daily to aid with healing.  Take antibiotic as prescribed.  Return to the ER or to urgent care center in 48 hours for wound recheck and packing removal.

## 2023-09-24 ENCOUNTER — Telehealth: Payer: Self-pay

## 2023-09-24 NOTE — Telephone Encounter (Signed)
Called patient to get him scheduled for a preop appt and phone rang and rang. Could not leave VM.... I will send a mychart message   Received a fax from [place]. Patient is needing surgery clearance.... no form was sent for [place] to fill out. I will call them and have them fax it over...Marland KitchenMarland Kitchen

## 2023-09-25 NOTE — Telephone Encounter (Signed)
Patient scheduled for next week 12/17 @ 11.  Form placed in review folder
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
## 2023-10-01 ENCOUNTER — Ambulatory Visit: Payer: BC Managed Care – PPO | Admitting: Physician Assistant

## 2023-10-01 ENCOUNTER — Encounter: Payer: Self-pay | Admitting: Physician Assistant

## 2023-10-01 VITALS — BP 139/87 | HR 72 | Temp 97.9°F | Ht 71.0 in | Wt 197.4 lb

## 2023-10-01 DIAGNOSIS — Z01818 Encounter for other preprocedural examination: Secondary | ICD-10-CM

## 2023-10-01 DIAGNOSIS — R0600 Dyspnea, unspecified: Secondary | ICD-10-CM

## 2023-10-01 DIAGNOSIS — Z87828 Personal history of other (healed) physical injury and trauma: Secondary | ICD-10-CM

## 2023-10-01 DIAGNOSIS — L700 Acne vulgaris: Secondary | ICD-10-CM | POA: Diagnosis not present

## 2023-10-01 DIAGNOSIS — I10 Essential (primary) hypertension: Secondary | ICD-10-CM

## 2023-10-01 DIAGNOSIS — G4700 Insomnia, unspecified: Secondary | ICD-10-CM | POA: Insufficient documentation

## 2023-10-01 LAB — CBC WITH DIFFERENTIAL/PLATELET
Basophils Absolute: 0.1 10*3/uL (ref 0.0–0.1)
Basophils Relative: 1.2 % (ref 0.0–3.0)
Eosinophils Absolute: 0.1 10*3/uL (ref 0.0–0.7)
Eosinophils Relative: 1.1 % (ref 0.0–5.0)
HCT: 38 % — ABNORMAL LOW (ref 39.0–52.0)
Hemoglobin: 11.2 g/dL — ABNORMAL LOW (ref 13.0–17.0)
Lymphocytes Relative: 34.2 % (ref 12.0–46.0)
Lymphs Abs: 2.1 10*3/uL (ref 0.7–4.0)
MCHC: 29.5 g/dL — ABNORMAL LOW (ref 30.0–36.0)
MCV: 67.1 fL — ABNORMAL LOW (ref 78.0–100.0)
Monocytes Absolute: 0.6 10*3/uL (ref 0.1–1.0)
Monocytes Relative: 9.3 % (ref 3.0–12.0)
Neutro Abs: 3.3 10*3/uL (ref 1.4–7.7)
Neutrophils Relative %: 54.2 % (ref 43.0–77.0)
Platelets: 395 10*3/uL (ref 150.0–400.0)
RBC: 5.66 Mil/uL (ref 4.22–5.81)
RDW: 20.8 % — ABNORMAL HIGH (ref 11.5–15.5)
WBC: 6.1 10*3/uL (ref 4.0–10.5)

## 2023-10-01 LAB — COMPREHENSIVE METABOLIC PANEL
ALT: 9 U/L (ref 0–53)
AST: 15 U/L (ref 0–37)
Albumin: 4.3 g/dL (ref 3.5–5.2)
Alkaline Phosphatase: 76 U/L (ref 39–117)
BUN: 9 mg/dL (ref 6–23)
CO2: 30 meq/L (ref 19–32)
Calcium: 8.9 mg/dL (ref 8.4–10.5)
Chloride: 104 meq/L (ref 96–112)
Creatinine, Ser: 0.82 mg/dL (ref 0.40–1.50)
GFR: 102.14 mL/min (ref >60.00)
Glucose, Bld: 99 mg/dL (ref 70–99)
Potassium: 4.5 meq/L (ref 3.5–5.1)
Sodium: 140 meq/L (ref 135–145)
Total Bilirubin: 0.5 mg/dL (ref 0.2–1.2)
Total Protein: 7.2 g/dL (ref 6.0–8.3)

## 2023-10-01 LAB — HEMOGLOBIN A1C: Hgb A1c MFr Bld: 6 % (ref 4.6–6.5)

## 2023-10-01 MED ORDER — TRAZODONE HCL 50 MG PO TABS
25.0000 mg | ORAL_TABLET | Freq: Every evening | ORAL | 2 refills | Status: DC | PRN
Start: 2023-10-01 — End: 2024-01-14
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
## 2023-10-01 NOTE — Assessment & Plan Note (Signed)
Discussed otc agents-- pt failed unisom + similar, melatonin Suggesting trazodone 25- 50 mg prn at bedtime.

## 2023-10-01 NOTE — Assessment & Plan Note (Signed)
Chronic, moderately controlled. Pt manages with amlodipine 5 mg

## 2023-10-02 ENCOUNTER — Other Ambulatory Visit: Payer: Self-pay | Admitting: Physician Assistant

## 2023-10-02 DIAGNOSIS — K625 Hemorrhage of anus and rectum: Secondary | ICD-10-CM

## 2023-10-02 DIAGNOSIS — D649 Anemia, unspecified: Secondary | ICD-10-CM

## 2023-10-03 ENCOUNTER — Ambulatory Visit: Payer: BC Managed Care – PPO | Admitting: Gastroenterology

## 2023-10-03 ENCOUNTER — Other Ambulatory Visit (INDEPENDENT_AMBULATORY_CARE_PROVIDER_SITE_OTHER): Payer: BC Managed Care – PPO

## 2023-10-03 ENCOUNTER — Encounter: Payer: Self-pay | Admitting: Gastroenterology

## 2023-10-03 VITALS — BP 132/84 | Wt 199.0 lb

## 2023-10-03 DIAGNOSIS — K625 Hemorrhage of anus and rectum: Secondary | ICD-10-CM

## 2023-10-03 DIAGNOSIS — Z8673 Personal history of transient ischemic attack (TIA), and cerebral infarction without residual deficits: Secondary | ICD-10-CM | POA: Diagnosis not present

## 2023-10-03 DIAGNOSIS — D509 Iron deficiency anemia, unspecified: Secondary | ICD-10-CM

## 2023-10-03 DIAGNOSIS — Z1211 Encounter for screening for malignant neoplasm of colon: Secondary | ICD-10-CM

## 2023-10-03 DIAGNOSIS — L0591 Pilonidal cyst without abscess: Secondary | ICD-10-CM | POA: Diagnosis not present

## 2023-10-03 LAB — IBC + FERRITIN
Ferritin: 6.5 ng/mL — ABNORMAL LOW (ref 22.0–322.0)
Iron: 14 ug/dL — ABNORMAL LOW (ref 42–165)
Saturation Ratios: 2.7 % — ABNORMAL LOW (ref 20.0–50.0)
TIBC: 511 ug/dL — ABNORMAL HIGH (ref 250.0–450.0)
Transferrin: 365 mg/dL — ABNORMAL HIGH (ref 212.0–360.0)

## 2023-10-03 MED ORDER — NA SULFATE-K SULFATE-MG SULF 17.5-3.13-1.6 GM/177ML PO SOLN: 1.0000 | Freq: Once | ORAL | 0 refills | Status: AC
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
## 2023-10-03 NOTE — Patient Instructions (Addendum)
Your provider has requested that you go to the basement level for lab work before leaving today. Press "B" on the elevator. The lab is located at the first door on the left as you exit the elevator.  You have been scheduled for a colonoscopy. Please follow written instructions given to you at your visit today.   Please pick up your prep supplies at the pharmacy within the next 1-3 days.  If you use inhalers (even only as needed), please bring them with you on the day of your procedure.  DO NOT TAKE 7 DAYS PRIOR TO TEST- Trulicity (dulaglutide) Ozempic, Wegovy (semaglutide) Mounjaro (tirzepatide) Bydureon Bcise (exanatide extended release)  DO NOT TAKE 1 DAY PRIOR TO YOUR TEST Rybelsus (semaglutide) Adlyxin (lixisenatide) Victoza (liraglutide) Byetta (exanatide) ___________________________________________________________________________   _______________________________________________________  If your blood pressure at your visit was 140/90 or greater, please contact your primary care physician to follow up on this.  _______________________________________________________  If you are age 69 or older, your body mass index should be between 23-30. Your Body mass index is 27.75 kg/m. If this is out of the aforementioned range listed, please consider follow up with your Primary Care Provider.  If you are age 57 or younger, your body mass index should be between 19-25. Your Body mass index is 27.75 kg/m. If this is out of the aformentioned range listed, please consider follow up with your Primary Care Provider.   ________________________________________________________  The [facility] providers would like to encourage you to use [facility] to communicate with providers for non-urgent requests or questions.  Due to long hold times on the telephone, sending your provider a message by [facility] may be a faster and more efficient way to get a response.  Please allow 48 business hours for a  response.  Please remember that this is for non-urgent requests.   It was a pleasure to see you today! . Thank you for trusting me with your gastrointestinal care!    Scott E.Tomasa Rand, MD

## 2023-10-04 ENCOUNTER — Telehealth (HOSPITAL_BASED_OUTPATIENT_CLINIC_OR_DEPARTMENT_OTHER): Payer: Self-pay

## 2023-10-06 ENCOUNTER — Ambulatory Visit (HOSPITAL_BASED_OUTPATIENT_CLINIC_OR_DEPARTMENT_OTHER): Payer: BC Managed Care – PPO

## 2023-10-08 NOTE — Progress Notes (Signed)
Mr. Carchidi,  Your iron levels are quite low.  I recommend you start taking a daily oral iron supplement.  This may cause your stools to appear black and become hard.  If needed, we can prescribe a stool softener to help with this.  If your colonoscopy does not reveal a cause of iron deficiency, we will need to an upper endoscopy. I would like to repeat a CBC and iron panel in 3 months  Bonita Quin,  Please prescribed ferrous sulfate 325 mg PO daily #90 and place order for repeat CBC/iron panel in 3 months.

## 2023-10-14 ENCOUNTER — Other Ambulatory Visit: Payer: BC Managed Care – PPO

## 2023-10-14 ENCOUNTER — Other Ambulatory Visit: Payer: Self-pay

## 2023-10-14 DIAGNOSIS — D509 Iron deficiency anemia, unspecified: Secondary | ICD-10-CM

## 2023-10-14 DIAGNOSIS — Z1211 Encounter for screening for malignant neoplasm of colon: Secondary | ICD-10-CM

## 2023-10-14 MED ORDER — FERROUS SULFATE 325 (65 FE) MG PO TBEC
325.0000 mg | DELAYED_RELEASE_TABLET | Freq: Every day | ORAL | 0 refills | Status: AC
Start: 2023-10-14 — End: ?

## 2023-10-16 LAB — H. PYLORI ANTIGEN, STOOL: H pylori Ag, Stl: NEGATIVE

## 2023-10-29 ENCOUNTER — Telehealth: Payer: Self-pay | Admitting: Physician Assistant

## 2023-10-29 NOTE — Telephone Encounter (Signed)
 Patient would like a call back from the dr patient did not state what he wanted just stated he needed to speak with the dr

## 2023-11-04 ENCOUNTER — Encounter (HOSPITAL_COMMUNITY): Admission: RE | Admit: 2023-11-04 | Payer: Self-pay | Source: Ambulatory Visit

## 2023-11-05 ENCOUNTER — Ambulatory Visit: Payer: 59 | Admitting: Physician Assistant

## 2023-11-05 NOTE — Progress Notes (Deleted)
      Established patient visit   Patient: Shawn [name]   DOB: [birth year]   53 y.o. Male  MRN: [id number] Visit Date: 11/05/2023  Today's healthcare provider: Alfredia Ferguson, PA-C   No chief complaint on file.  Subjective     ***  Medications: Outpatient Medications Prior to Visit  Medication Sig   amLODipine (NORVASC) 5 MG tablet Take 1 tablet (5 mg total) by mouth daily.   doxycycline (VIBRAMYCIN) 100 MG capsule Take 1 capsule (100 mg total) by mouth 2 (two) times daily.   ferrous sulfate 325 (65 FE) MG EC tablet Take 1 tablet (325 mg total) by mouth daily with breakfast.   ibuprofen (ADVIL) 600 MG tablet Take 1 tablet (600 mg total) by mouth every 6 (six) hours as needed.   multivitamin (ONE-A-DAY MEN'S) TABS tablet Take 1 tablet by mouth daily.   Omega-3 Fatty Acids (FISH OIL PO) Take 1 capsule by mouth daily.   traZODone (DESYREL) 50 MG tablet Take 0.5-1 tablets (25-50 mg total) by mouth at bedtime as needed for sleep.   No facility-administered medications prior to visit.    Review of Systems {Insert previous labs (optional):23779} {See past labs  Heme  Chem  Endocrine  Serology  Results Review (optional):1}   Objective    There were no vitals taken for this visit. {Insert last BP/Wt (optional):23777}{See vitals history (optional):1}  Physical Exam  ***  No results found for any visits on 11/05/23.  Assessment & Plan    There are no diagnoses linked to this encounter.  ***  No follow-ups on file.       Alfredia Ferguson, PA-C  [facility] at [facility] [phone] (phone) [phone] (fax)  [facility]

## 2023-11-08 ENCOUNTER — Ambulatory Visit: Payer: 59 | Admitting: Physician Assistant

## 2023-11-08 NOTE — Progress Notes (Deleted)
      Established patient visit   Patient: Shawn [name]   DOB: 10-10-1971   53 y.o. Male  MRN: [id number] Visit Date: 11/08/2023  Today's healthcare provider: Alfredia Ferguson, PA-C   No chief complaint on file.  Subjective     ***  Medications: Outpatient Medications Prior to Visit  Medication Sig   amLODipine (NORVASC) 5 MG tablet Take 1 tablet (5 mg total) by mouth daily.   doxycycline (VIBRAMYCIN) 100 MG capsule Take 1 capsule (100 mg total) by mouth 2 (two) times daily.   ferrous sulfate 325 (65 FE) MG EC tablet Take 1 tablet (325 mg total) by mouth daily with breakfast.   ibuprofen (ADVIL) 600 MG tablet Take 1 tablet (600 mg total) by mouth every 6 (six) hours as needed.   multivitamin (ONE-A-DAY MEN'S) TABS tablet Take 1 tablet by mouth daily.   Omega-3 Fatty Acids (FISH OIL PO) Take 1 capsule by mouth daily.   traZODone (DESYREL) 50 MG tablet Take 0.5-1 tablets (25-50 mg total) by mouth at bedtime as needed for sleep.   No facility-administered medications prior to visit.    Review of Systems {Insert previous labs (optional):23779} {See past labs  Heme  Chem  Endocrine  Serology  Results Review (optional):1}   Objective    There were no vitals taken for this visit. {Insert last BP/Wt (optional):23777}{See vitals history (optional):1}  Physical Exam  ***  No results found for any visits on 11/08/23.  Assessment & Plan    There are no diagnoses linked to this encounter.  ***  No follow-ups on file.       Alfredia Ferguson, PA-C  [facility] at [facility] [phone] (phone) [phone] (fax)  [facility]

## 2023-11-15 ENCOUNTER — Ambulatory Visit (HOSPITAL_COMMUNITY): Admit: 2023-11-15 | Payer: Self-pay | Admitting: Surgery

## 2023-11-15 SURGERY — EXCISION, SIMPLE PILONIDAL CYST
Anesthesia: General

## 2023-11-18 ENCOUNTER — Telehealth: Payer: Self-pay | Admitting: Gastroenterology

## 2023-11-18 NOTE — Telephone Encounter (Signed)
Returned the patient's phone call and advised him to just stay on clear liquids today. Pt  verbalized understanding.

## 2023-11-18 NOTE — Telephone Encounter (Signed)
Inbound call from patient requesting to speak about prep instructions for tomorrow's 2.4 colonoscopy. Please advise, thank you.

## 2023-11-19 ENCOUNTER — Encounter: Payer: Self-pay | Admitting: Gastroenterology

## 2023-11-19 ENCOUNTER — Ambulatory Visit (AMBULATORY_SURGERY_CENTER): Payer: 59 | Admitting: Gastroenterology

## 2023-11-19 VITALS — BP 141/92 | HR 62 | Temp 98.6°F | Resp 14 | Ht 71.0 in | Wt 192.0 lb

## 2023-11-19 DIAGNOSIS — K641 Second degree hemorrhoids: Secondary | ICD-10-CM

## 2023-11-19 DIAGNOSIS — Z1211 Encounter for screening for malignant neoplasm of colon: Secondary | ICD-10-CM

## 2023-11-19 DIAGNOSIS — D509 Iron deficiency anemia, unspecified: Secondary | ICD-10-CM | POA: Diagnosis not present

## 2023-11-19 DIAGNOSIS — K625 Hemorrhage of anus and rectum: Secondary | ICD-10-CM | POA: Diagnosis not present

## 2023-11-19 MED ORDER — SODIUM CHLORIDE 0.9 % IV SOLN: 500.0000 mL | Freq: Once | INTRAVENOUS | Status: DC

## 2023-11-19 NOTE — Patient Instructions (Signed)
 Educational handout provided to patient related to Hemorrhoids  Resume previous diet  Continue present medications  REPEAT COLONOSCOPY IN 1 YEAR DUE TO POOR BOWEL PREP  YOU HAD AN ENDOSCOPIC PROCEDURE TODAY AT THE [facility]:   Refer to the procedure report that was given to you for any specific questions about what was found during the examination.  If the procedure report does not answer your questions, please call your gastroenterologist to clarify.  If you requested that your care partner not be given the details of your procedure findings, then the procedure report has been included in a sealed envelope for you to review at your convenience later.  YOU SHOULD EXPECT: Some feelings of bloating in the abdomen. Passage of more gas than usual.  Walking can help get rid of the air that was put into your GI tract during the procedure and reduce the bloating. If you had a lower endoscopy (such as a colonoscopy or flexible sigmoidoscopy) you may notice spotting of blood in your stool or on the toilet paper. If you underwent a bowel prep for your procedure, you may not have a normal bowel movement for a few days.  Please Note:  You might notice some irritation and congestion in your nose or some drainage.  This is from the oxygen used during your procedure.  There is no need for concern and it should clear up in a day or so.  SYMPTOMS TO REPORT IMMEDIATELY:  Following lower endoscopy (colonoscopy or flexible sigmoidoscopy):  Excessive amounts of blood in the stool  Significant tenderness or worsening of abdominal pains  Swelling of the abdomen that is new, acute  Fever of 100F or higher  For urgent or emergent issues, a gastroenterologist can be reached at any hour by calling [phone]. Do not use MyChart messaging for urgent concerns.    DIET:  We do recommend a small meal at first, but then you may proceed to your regular diet.  Drink plenty of fluids but you should avoid  alcoholic beverages for 24 hours.  ACTIVITY:  You should plan to take it easy for the rest of today and you should NOT DRIVE or use heavy machinery until tomorrow (because of the sedation medicines used during the test).    FOLLOW UP: Our staff will call the number listed on your records the next business day following your procedure.  We will call around 7:15- 8:00 am to check on you and address any questions or concerns that you may have regarding the information given to you following your procedure. If we do not reach you, we will leave a message.     If any biopsies were taken you will be contacted by phone or by letter within the next 1-3 weeks.  Please call us  at [phone] if you have not heard about the biopsies in 3 weeks.    SIGNATURES/CONFIDENTIALITY: You and/or your care partner have signed paperwork which will be entered into your electronic medical record.  These signatures attest to the fact that that the information above on your After Visit Summary has been reviewed and is understood.  Full responsibility of the confidentiality of this discharge information lies with you and/or your care-partner.

## 2023-11-19 NOTE — Op Note (Signed)
 [facility] Patient Name: Rivaan [name] Procedure Date: 11/19/2023 2:37 PM MRN: [id number] Endoscopist: Glendia E. Stacia , MD, [id number] Age: 53 Referring MD:  Date of Birth: [birth year] Gender: Male Account #: [id number] Procedure:                Colonoscopy Indications:              Rectal bleeding, Iron deficiency anemia Medicines:                Monitored Anesthesia Care Procedure:                Pre-Anesthesia Assessment:                           - Prior to the procedure, a History and Physical                            was performed, and patient medications and                            allergies were reviewed. The patient's tolerance of                            previous anesthesia was also reviewed. The risks                            and benefits of the procedure and the sedation                            options and risks were discussed with the patient.                            All questions were answered, and informed consent                            was obtained. Prior Anticoagulants: The patient has                            taken no anticoagulant or antiplatelet agents. ASA                            Grade Assessment: II - A patient with mild systemic                            disease. After reviewing the risks and benefits,                            the patient was deemed in satisfactory condition to                            undergo the procedure.                           After obtaining informed consent, the colonoscope  was passed under direct vision. Throughout the                            procedure, the patient's blood pressure, pulse, and                            oxygen saturations were monitored continuously. The                            CF [id number] #[id number] was introduced through the anus                            and advanced to the the cecum, identified by                            appendiceal  orifice and ileocecal valve. The                            colonoscopy was performed without difficulty. The                            patient tolerated the procedure well. The quality                            of the bowel preparation was poor. The terminal                            ileum, ileocecal valve, appendiceal orifice, and                            rectum were photographed. Scope In: 2:50:45 PM Scope Out: 3:06:20 PM Scope Withdrawal Time: 0 hours 9 minutes 43 seconds  Total Procedure Duration: 0 hours 15 minutes 35 seconds  Findings:                 Hemorrhoids were found on perianal exam.                           The digital rectal exam was normal. Pertinent                            negatives include normal sphincter tone and no                            palpable rectal lesions.                           A large amount of stool was found in the entire                            colon, interfering with visualization.                           The exam was otherwise normal throughout the  examined colon.                           Non-bleeding internal hemorrhoids were found during                            retroflexion. The hemorrhoids were large and Grade                            II (internal hemorrhoids that prolapse but reduce                            spontaneously).                           No additional abnormalities were found on                            retroflexion. Complications:            No immediate complications. Estimated Blood Loss:     Estimated blood loss: none. Impression:               - Preparation of the colon was poor.                           - Hemorrhoids found on perianal exam.                           - Stool in the entire examined colon.                           - Non-bleeding internal hemorrhoids. This is the                            source of the patient's hematochezia.                           - No  specimens collected. Recommendation:           - Patient has a contact number available for                            emergencies. The signs and symptoms of potential                            delayed complications were discussed with the                            patient. Return to normal activities tomorrow.                            Written discharge instructions were provided to the                            patient.                           -  Resume previous diet.                           - Continue present medications.                           - Repeat colonoscopy in 1 year because the bowel                            preparation was poor.                           - Repeat CBC/iron panel in March. Recommend EGD if                            anemia persists. Kelso Bibby E. Stacia, MD 11/19/2023 3:20:58 PM This report has been signed electronically.
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
## 2023-11-19 NOTE — Progress Notes (Signed)
 Sedate, gd SR, tolerated procedure well, VSS, report to RN
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
## 2023-11-20 ENCOUNTER — Telehealth: Payer: Self-pay | Admitting: *Deleted

## 2023-11-20 NOTE — Telephone Encounter (Signed)
 Post procedure follow up call placed, no answer and no VM.

## 2023-12-25 ENCOUNTER — Ambulatory Visit (HOSPITAL_COMMUNITY): Admission: EM | Admit: 2023-12-25 | Discharge: 2023-12-25 | Disposition: A | Discharge status: Home / Self Care

## 2023-12-25 ENCOUNTER — Encounter (HOSPITAL_COMMUNITY): Payer: Self-pay

## 2023-12-25 DIAGNOSIS — K047 Periapical abscess without sinus: Secondary | ICD-10-CM | POA: Diagnosis not present

## 2023-12-25 MED ORDER — AMOXICILLIN-POT CLAVULANATE 875-125 MG PO TABS
1.0000 | ORAL_TABLET | Freq: Two times a day (BID) | ORAL | 0 refills | Status: AC
Start: 2023-12-25 — End: 2024-01-04

## 2023-12-25 MED ORDER — IBUPROFEN 800 MG PO TABS
800.0000 mg | ORAL_TABLET | Freq: Three times a day (TID) | ORAL | 0 refills | Status: AC
Start: 2023-12-25 — End: ?

## 2023-12-25 MED ORDER — IBUPROFEN 800 MG PO TABS
ORAL_TABLET | ORAL | Status: AC
  Filled 2023-12-25: qty 1

## 2023-12-25 MED ORDER — IBUPROFEN 800 MG PO TABS
800.0000 mg | ORAL_TABLET | Freq: Once | ORAL | Status: AC
  Administered 2023-12-25: 800 mg via ORAL
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
## 2023-12-25 NOTE — ED Triage Notes (Signed)
 Patient reports that he has a dental issue and infection on the upper front teeth. Patient states he is getting a" headache from the infection." X 2 days.   Patient states he has taken Ibuprofen 600 mg at 0930.

## 2023-12-25 NOTE — Discharge Instructions (Addendum)
 1. Dental infection (Primary) - ibuprofen (ADVIL) tablet 800 mg given in UC for acute dental pain - ibuprofen (ADVIL) 800 MG tablet; Take 1 tablet (800 mg total) by mouth 3 (three) times daily for pain secondary to dental infection - amoxicillin-clavulanate (AUGMENTIN) 875-125 MG tablet; Take 1 tablet by mouth every 12 (twelve) hours for 10 days for dental infection -Follow-up with dentist as soon as possible for further evaluation and management of ongoing dental infection.

## 2024-01-11 ENCOUNTER — Other Ambulatory Visit: Payer: Self-pay | Admitting: Physician Assistant

## 2024-01-11 DIAGNOSIS — G4700 Insomnia, unspecified: Secondary | ICD-10-CM

## 2024-02-12 ENCOUNTER — Other Ambulatory Visit: Payer: Self-pay | Admitting: Physician Assistant

## 2024-02-12 DIAGNOSIS — G4700 Insomnia, unspecified: Secondary | ICD-10-CM

## 2024-04-15 ENCOUNTER — Ambulatory Visit: Admitting: Physician Assistant

## 2024-04-15 ENCOUNTER — Encounter: Payer: Self-pay | Admitting: Physician Assistant

## 2024-04-15 VITALS — BP 152/99

## 2024-04-15 DIAGNOSIS — L700 Acne vulgaris: Secondary | ICD-10-CM

## 2024-04-15 MED ORDER — TRETINOIN 0.1 % EX CREA: TOPICAL_CREAM | Freq: Every day | CUTANEOUS | 3 refills | Status: AC

## 2024-04-15 MED ORDER — DOXYCYCLINE HYCLATE 100 MG PO TABS: 100.0000 mg | ORAL_TABLET | Freq: Two times a day (BID) | ORAL | 2 refills | Status: AC

## 2024-04-15 MED ORDER — DOXYCYCLINE HYCLATE 100 MG PO TABS
100.0000 mg | ORAL_TABLET | Freq: Two times a day (BID) | ORAL | 2 refills | Status: DC
Start: 2024-04-15 — End: 2024-04-15

## 2024-04-15 NOTE — Progress Notes (Signed)
   New Patient Visit   Subjective  Shawn [name] is a 53 y.o. male who presents for the following: Acne of back for about 4 years. Breaking out has gotten worse over the years. He is not currently using any medications. He was given doxycycline  and bacitracin in the past from the ED and was told that he had cellulitis.   Accompanied by girlfriend today.  The following portions of the chart were reviewed this encounter and updated as appropriate: medications, allergies, medical history  Review of Systems:  No other skin or systemic complaints except as noted in HPI or Assessment and Plan.  Objective  Well appearing patient in no apparent distress; mood and affect are within normal limits.   A focused examination was performed of the following areas: Face, chest, axillae and Back   Relevant exam findings are noted in the Assessment and Plan.       Assessment & Plan   ACNE VULGARIS Exam: Open comedones and hyperpigmented macules with scarring of back.   Treatment Plan: Recommend Panoxyl 10 creamy wash every morning to wash trunk. May wash with a gentle cleanser such as Cerave or Cetaphil.  Start doxycycline  100 mg 1 tablet twice daily with food and plenty of fluid. Tretinoin 0.1% cream Apply to affected area nightly.    ACNE VULGARIS    Return in about 4 months (around 08/16/2024) for Acne.  I, Roseline Hutchinson, CMA, am acting as scribe for Rolando Hessling K, PA-C .   Documentation: I have reviewed the above documentation for accuracy and completeness, and I agree with the above.  Crue Otero K, PA-C

## 2024-04-15 NOTE — Patient Instructions (Addendum)
 Doxycycline  should be taken with food to prevent nausea. Do not lay down for 30 minutes after taking. Be cautious with sun exposure and use good sun protection while on this medication. Pregnant women should not take this medication.     Topical retinoid medications like tretinoin/Retin-A, adapalene/Differin, tazarotene/Fabior, and Epiduo/Epiduo Forte can cause dryness and irritation when first started. Only apply a pea-sized amount to the entire affected area. Avoid applying it around the eyes, edges of mouth and creases at the nose. If you experience irritation, use a good moisturizer first and/or apply the medicine less often. If you are doing well with the medicine, you can increase how often you use it until you are applying every night. Be careful with sun protection while using this medication as it can make you sensitive to the sun. This medicine should not be used by pregnant women.      Important Information  Due to recent changes in healthcare laws, you may see results of your pathology and/or laboratory studies on MyChart before the doctors have had a chance to review them. We understand that in some cases there may be results that are confusing or concerning to you. Please understand that not all results are received at the same time and often the doctors may need to interpret multiple results in order to provide you with the best plan of care or course of treatment. Therefore, we ask that you please give us  2 business days to thoroughly review all your results before contacting the office for clarification. Should we see a critical lab result, you will be contacted sooner.   If You Need Anything After Your Visit  If you have any questions or concerns for your doctor, please call our main line at [phone] If no one answers, please leave a voicemail as directed and we will return your call as soon as possible. Messages left after 4 pm will be answered the following business  day.   You may also send us  a message via MyChart. We typically respond to MyChart messages within 1-2 business days.  For prescription refills, please ask your pharmacy to contact our office. Our fax number is [phone].  If you have an urgent issue when the clinic is closed that cannot wait until the next business day, you can page your doctor at the number below.    Please note that while we do our best to be available for urgent issues outside of office hours, we are not available 24/7.   If you have an urgent issue and are unable to reach us , you may choose to seek medical care at your doctor's office, retail clinic, urgent care center, or emergency room.  If you have a medical emergency, please immediately call 911 or go to the emergency department. In the event of inclement weather, please call our main line at [phone] for an update on the status of any delays or closures.  Dermatology Medication Tips: Please keep the boxes that topical medications come in in order to help keep track of the instructions about where and how to use these. Pharmacies typically print the medication instructions only on the boxes and not directly on the medication tubes.   If your medication is too expensive, please contact our office at [phone] or send us  a message through MyChart.   We are unable to tell what your co-pay for medications will be in advance as this is different depending on your insurance coverage. However,  we may be able to find a substitute medication at lower cost or fill out paperwork to get insurance to cover a needed medication.   If a prior authorization is required to get your medication covered by your insurance company, please allow us  1-2 business days to complete this process.  Drug prices often vary depending on where the prescription is filled and some pharmacies may offer cheaper prices.  The website www.goodrx.com contains coupons for medications through  different pharmacies. The prices here do not account for what the cost may be with help from insurance (it may be cheaper with your insurance), but the website can give you the price if you did not use any insurance.  - You can print the associated coupon and take it with your prescription to the pharmacy.  - You may also stop by our office during regular business hours and pick up a GoodRx coupon card.  - If you need your prescription sent electronically to a different pharmacy, notify our office through [facility] or by phone at [phone]

## 2024-05-09 ENCOUNTER — Other Ambulatory Visit: Payer: Self-pay | Admitting: Physician Assistant

## 2024-05-09 DIAGNOSIS — G4700 Insomnia, unspecified: Secondary | ICD-10-CM

## 2024-08-18 ENCOUNTER — Ambulatory Visit: Admitting: Physician Assistant

## 2024-10-26 ENCOUNTER — Ambulatory Visit: Admitting: Medical

## 2024-10-27 ENCOUNTER — Ambulatory Visit: Admitting: Medical

## 2024-10-27 ENCOUNTER — Encounter: Payer: Self-pay | Admitting: Medical

## 2024-10-27 VITALS — BP 163/93 | HR 86 | Temp 98.2°F | Resp 16 | Ht 71.0 in | Wt 190.2 lb

## 2024-10-27 DIAGNOSIS — L089 Local infection of the skin and subcutaneous tissue, unspecified: Secondary | ICD-10-CM

## 2024-10-27 DIAGNOSIS — G8929 Other chronic pain: Secondary | ICD-10-CM

## 2024-10-27 DIAGNOSIS — F17200 Nicotine dependence, unspecified, uncomplicated: Secondary | ICD-10-CM | POA: Diagnosis not present

## 2024-10-27 DIAGNOSIS — R739 Hyperglycemia, unspecified: Secondary | ICD-10-CM

## 2024-10-27 DIAGNOSIS — I10 Essential (primary) hypertension: Secondary | ICD-10-CM | POA: Diagnosis not present

## 2024-10-27 DIAGNOSIS — L723 Sebaceous cyst: Secondary | ICD-10-CM | POA: Diagnosis not present

## 2024-10-27 DIAGNOSIS — M545 Low back pain, unspecified: Secondary | ICD-10-CM

## 2024-10-27 DIAGNOSIS — R110 Nausea: Secondary | ICD-10-CM

## 2024-10-27 DIAGNOSIS — M546 Pain in thoracic spine: Secondary | ICD-10-CM

## 2024-10-27 DIAGNOSIS — K859 Acute pancreatitis without necrosis or infection, unspecified: Secondary | ICD-10-CM | POA: Diagnosis not present

## 2024-10-27 MED ORDER — VALSARTAN 80 MG PO TABS: 80.0000 mg | ORAL_TABLET | Freq: Every day | ORAL | 0 refills | Status: AC

## 2024-10-27 MED ORDER — DOXYCYCLINE HYCLATE 100 MG PO TABS: 100.0000 mg | ORAL_TABLET | Freq: Two times a day (BID) | ORAL | 0 refills | Status: AC
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
## 2024-10-27 NOTE — Patient Instructions (Addendum)
 Thoracic and lumbar back pain Pain at thoracic and lumbar junction for two weeks, improving. Likely muscular or degenerative.  - Ordered thoracic and lumbar spine x-rays. - Advised Tylenol  for pain management. - Advised against NSAIDs due to potential blood pressure increase.  Hypertension Blood pressure 160/100 mmHg. Amlodipine  discontinued due to side effects. Discussed importance of control due to stroke history and risk of complications. - Prescribed valsartan  80 mg daily. - Ordered blood work for kidney function and average blood sugar levels. - Advised against exercise until blood pressure is controlled.  Nicotine dependence, cigarettes Smoking half a pack daily for 10-12 years. Discussed risks including stroke and COPD. Consideration of Wellbutrin for cessation after blood pressure control. Prefers cessation without medication initially. - Advised smoking cessation. - Discussed potential Wellbutrin use after blood pressure control.  Hyperglycemia Previous elevated blood sugar levels. Family history of diabetes. Discussed importance of monitoring. - Ordered blood work for average blood sugar levels.  History of stroke Previous stroke with no current symptoms. Discussed importance of controlling risk factors. - Continue to monitor and manage risk factors for stroke recurrence.  nausea occasional with greasy foods -lipase  Infected sebaceous cyst that is currently draining/ -got wound culture. -advise warm compresses twice daily. -rx doxycycline  100 mg twice daily for 10 days.   -follow up in 10 days or sooner if needed  Follow up 10 days or sooner if needed

## 2024-10-28 ENCOUNTER — Ambulatory Visit: Payer: Self-pay | Admitting: Medical

## 2024-10-28 ENCOUNTER — Ambulatory Visit (HOSPITAL_BASED_OUTPATIENT_CLINIC_OR_DEPARTMENT_OTHER)
Admission: RE | Admit: 2024-10-28 | Discharge: 2024-10-28 | Disposition: A | Discharge status: Home / Self Care | Source: Ambulatory Visit | Attending: Medical | Admitting: Medical

## 2024-10-28 DIAGNOSIS — M546 Pain in thoracic spine: Secondary | ICD-10-CM | POA: Insufficient documentation

## 2024-10-28 DIAGNOSIS — M545 Low back pain, unspecified: Secondary | ICD-10-CM | POA: Insufficient documentation

## 2024-10-28 DIAGNOSIS — G8929 Other chronic pain: Secondary | ICD-10-CM | POA: Diagnosis present

## 2024-10-28 LAB — COMPREHENSIVE METABOLIC PANEL WITH GFR
ALT: 11 U/L (ref 3–53)
AST: 20 U/L (ref 5–37)
Albumin: 4.7 g/dL (ref 3.5–5.2)
Alkaline Phosphatase: 61 U/L (ref 39–117)
BUN: 12 mg/dL (ref 6–23)
CO2: 27 meq/L (ref 19–32)
Calcium: 9.4 mg/dL (ref 8.4–10.5)
Chloride: 104 meq/L (ref 96–112)
Creatinine, Ser: 0.83 mg/dL (ref 0.40–1.50)
GFR: 101 mL/min
Glucose, Bld: 75 mg/dL (ref 70–99)
Potassium: 4.2 meq/L (ref 3.5–5.1)
Sodium: 140 meq/L (ref 135–145)
Total Bilirubin: 0.4 mg/dL (ref 0.2–1.2)
Total Protein: 7.7 g/dL (ref 6.0–8.3)

## 2024-10-28 LAB — HEMOGLOBIN A1C: Hgb A1c MFr Bld: 5.6 % (ref 4.6–6.5)

## 2024-10-28 LAB — LIPASE: Lipase: 71 U/L — ABNORMAL HIGH (ref 11.0–59.0)

## 2024-10-28 NOTE — Addendum Note (Signed)
 Addended by: DORINA DALLAS DORINA PA-C M on: 10/28/2024 05:33 PM   Modules accepted: Orders

## 2024-10-29 MED ORDER — CYCLOBENZAPRINE HCL 5 MG PO TABS: 5.0000 mg | ORAL_TABLET | Freq: Every day | ORAL | 0 refills | Status: DC

## 2024-10-29 NOTE — Addendum Note (Signed)
 Addended by: DORINA DALLAS DORINA PA-C M on: 10/29/2024 12:37 PM   Modules accepted: Orders

## 2024-10-31 ENCOUNTER — Ambulatory Visit (HOSPITAL_BASED_OUTPATIENT_CLINIC_OR_DEPARTMENT_OTHER)
Admission: RE | Admit: 2024-10-31 | Discharge: 2024-10-31 | Disposition: A | Source: Ambulatory Visit | Attending: Medical | Admitting: Medical

## 2024-10-31 DIAGNOSIS — K859 Acute pancreatitis without necrosis or infection, unspecified: Secondary | ICD-10-CM | POA: Insufficient documentation

## 2024-10-31 LAB — CULTURE,AEROBIC BACTERIA WITH GRAM STAIN
MICRO NUMBER:: 17462186
RESULT:: NO GROWTH
SPECIMEN QUALITY:: ADEQUATE

## 2024-10-31 MED ORDER — IOHEXOL 300 MG/ML  SOLN
100.0000 mL | Freq: Once | INTRAMUSCULAR | Status: AC | PRN
  Administered 2024-10-31: 100 mL via INTRAVENOUS

## 2024-11-04 MED ORDER — CYCLOBENZAPRINE HCL 5 MG PO TABS: 5.0000 mg | ORAL_TABLET | Freq: Every day | ORAL | 0 refills | Status: AC

## 2024-11-04 NOTE — Addendum Note (Signed)
 Addended by: DORINA DALLAS DORINA PA-C M on: 11/04/2024 08:07 PM   Modules accepted: Orders

## 2024-11-10 ENCOUNTER — Ambulatory Visit: Admitting: Medical

## 2024-11-23 ENCOUNTER — Encounter: Admitting: Physician Assistant
